# Patient Record
Sex: Male | Born: 1945 | Race: Black or African American | Hispanic: No
Health system: Southern US, Community
[De-identification: ages and names within clinical notes are randomized; demographics above are authoritative.]

---

## 2014-04-17 ENCOUNTER — Other Ambulatory Visit (HOSPITAL_COMMUNITY): Payer: Medicare Other

## 2014-04-17 ENCOUNTER — Inpatient Hospital Stay
Admission: RE | Admit: 2014-04-17 | Discharge: 2014-05-25 | Disposition: A | Discharge status: Skilled Nursing Facility | Payer: Medicare Other | Source: Other Acute Inpatient Hospital | Attending: Internal Medicine | Admitting: Internal Medicine

## 2014-04-17 DIAGNOSIS — L97929 Non-pressure chronic ulcer of unspecified part of left lower leg with unspecified severity: Secondary | ICD-10-CM

## 2014-04-17 DIAGNOSIS — J189 Pneumonia, unspecified organism: Secondary | ICD-10-CM

## 2014-04-17 DIAGNOSIS — J9690 Respiratory failure, unspecified, unspecified whether with hypoxia or hypercapnia: Secondary | ICD-10-CM

## 2014-04-17 DIAGNOSIS — J9620 Acute and chronic respiratory failure, unspecified whether with hypoxia or hypercapnia: Secondary | ICD-10-CM

## 2014-04-17 DIAGNOSIS — A419 Sepsis, unspecified organism: Secondary | ICD-10-CM

## 2014-04-17 DIAGNOSIS — Z0189 Encounter for other specified special examinations: Secondary | ICD-10-CM

## 2014-04-17 DIAGNOSIS — Z930 Tracheostomy status: Secondary | ICD-10-CM

## 2014-04-17 DIAGNOSIS — N179 Acute kidney failure, unspecified: Secondary | ICD-10-CM

## 2014-04-17 DIAGNOSIS — G9341 Metabolic encephalopathy: Secondary | ICD-10-CM

## 2014-04-17 DIAGNOSIS — E1110 Type 2 diabetes mellitus with ketoacidosis without coma: Secondary | ICD-10-CM

## 2014-04-17 DIAGNOSIS — Z9911 Dependence on respirator [ventilator] status: Secondary | ICD-10-CM

## 2014-04-17 DIAGNOSIS — E870 Hyperosmolality and hypernatremia: Secondary | ICD-10-CM

## 2014-04-17 DIAGNOSIS — R109 Unspecified abdominal pain: Secondary | ICD-10-CM | POA: Insufficient documentation

## 2014-04-17 DIAGNOSIS — R6521 Severe sepsis with septic shock: Secondary | ICD-10-CM

## 2014-04-17 DIAGNOSIS — Z95828 Presence of other vascular implants and grafts: Secondary | ICD-10-CM

## 2014-04-17 DIAGNOSIS — Z89511 Acquired absence of right leg below knee: Secondary | ICD-10-CM

## 2014-04-17 DIAGNOSIS — Z13810 Encounter for screening for upper gastrointestinal disorder: Secondary | ICD-10-CM

## 2014-04-17 DIAGNOSIS — R627 Adult failure to thrive: Secondary | ICD-10-CM

## 2014-04-17 DIAGNOSIS — I639 Cerebral infarction, unspecified: Secondary | ICD-10-CM

## 2014-04-17 LAB — COMPREHENSIVE METABOLIC PANEL
ALT: 17 U/L (ref 0–53)
ANION GAP: 14 (ref 5–15)
AST: 24 U/L (ref 0–37)
Albumin: 4 g/dL (ref 3.5–5.2)
Alkaline Phosphatase: 40 U/L (ref 39–117)
BUN: 34 mg/dL — AB (ref 6–23)
CALCIUM: 8.4 mg/dL (ref 8.4–10.5)
CO2: 32 mmol/L (ref 19–32)
CREATININE: 1.59 mg/dL — AB (ref 0.50–1.35)
Chloride: 106 mmol/L (ref 96–112)
GFR calc non Af Amer: 42 mL/min — ABNORMAL LOW (ref >90)
GFR, EST AFRICAN AMERICAN: 49 mL/min — AB (ref >90)
Glucose, Bld: 150 mg/dL — ABNORMAL HIGH (ref 70–99)
Potassium: 3.3 mmol/L — ABNORMAL LOW (ref 3.5–5.1)
SODIUM: 152 mmol/L — AB (ref 135–145)
TOTAL PROTEIN: 6.4 g/dL (ref 6.0–8.3)
Total Bilirubin: 0.7 mg/dL (ref 0.3–1.2)

## 2014-04-17 LAB — CBC
HCT: 24.5 % — ABNORMAL LOW (ref 39.0–52.0)
HEMOGLOBIN: 7.6 g/dL — AB (ref 13.0–17.0)
MCH: 26.9 pg (ref 26.0–34.0)
MCHC: 31 g/dL (ref 30.0–36.0)
MCV: 86.6 fL (ref 78.0–100.0)
Platelets: 143 10*3/uL — ABNORMAL LOW (ref 150–400)
RBC: 2.83 MIL/uL — ABNORMAL LOW (ref 4.22–5.81)
RDW: 15.6 % — ABNORMAL HIGH (ref 11.5–15.5)
WBC: 9.4 10*3/uL (ref 4.0–10.5)

## 2014-04-17 LAB — PROTIME-INR
INR: 1.14 (ref 0.00–1.49)
Prothrombin Time: 14.8 seconds (ref 11.6–15.2)

## 2014-04-17 LAB — PHOSPHORUS: Phosphorus: 4.2 mg/dL (ref 2.3–4.6)

## 2014-04-17 LAB — MAGNESIUM: Magnesium: 2.1 mg/dL (ref 1.5–2.5)

## 2014-04-18 ENCOUNTER — Other Ambulatory Visit (HOSPITAL_COMMUNITY): Payer: Medicare Other

## 2014-04-18 DIAGNOSIS — R627 Adult failure to thrive: Secondary | ICD-10-CM

## 2014-04-18 DIAGNOSIS — J9620 Acute and chronic respiratory failure, unspecified whether with hypoxia or hypercapnia: Secondary | ICD-10-CM

## 2014-04-18 DIAGNOSIS — A419 Sepsis, unspecified organism: Secondary | ICD-10-CM

## 2014-04-18 DIAGNOSIS — Z89511 Acquired absence of right leg below knee: Secondary | ICD-10-CM

## 2014-04-18 DIAGNOSIS — R6521 Severe sepsis with septic shock: Secondary | ICD-10-CM

## 2014-04-18 DIAGNOSIS — Z9911 Dependence on respirator [ventilator] status: Secondary | ICD-10-CM

## 2014-04-18 DIAGNOSIS — L97929 Non-pressure chronic ulcer of unspecified part of left lower leg with unspecified severity: Secondary | ICD-10-CM

## 2014-04-18 DIAGNOSIS — G9341 Metabolic encephalopathy: Secondary | ICD-10-CM

## 2014-04-18 DIAGNOSIS — J9601 Acute respiratory failure with hypoxia: Secondary | ICD-10-CM

## 2014-04-18 DIAGNOSIS — I639 Cerebral infarction, unspecified: Secondary | ICD-10-CM

## 2014-04-18 DIAGNOSIS — N179 Acute kidney failure, unspecified: Secondary | ICD-10-CM

## 2014-04-18 DIAGNOSIS — E1110 Type 2 diabetes mellitus with ketoacidosis without coma: Secondary | ICD-10-CM

## 2014-04-18 DIAGNOSIS — J189 Pneumonia, unspecified organism: Secondary | ICD-10-CM | POA: Diagnosis not present

## 2014-04-18 DIAGNOSIS — E870 Hyperosmolality and hypernatremia: Secondary | ICD-10-CM

## 2014-04-18 LAB — BASIC METABOLIC PANEL
ANION GAP: 17 — AB (ref 5–15)
BUN: 40 mg/dL — AB (ref 6–23)
CALCIUM: 8.8 mg/dL (ref 8.4–10.5)
CHLORIDE: 109 mmol/L (ref 96–112)
CO2: 26 mmol/L (ref 19–32)
CREATININE: 1.56 mg/dL — AB (ref 0.50–1.35)
GFR calc Af Amer: 50 mL/min — ABNORMAL LOW (ref >90)
GFR calc non Af Amer: 43 mL/min — ABNORMAL LOW (ref >90)
Glucose, Bld: 151 mg/dL — ABNORMAL HIGH (ref 70–99)
POTASSIUM: 5 mmol/L (ref 3.5–5.1)
Sodium: 152 mmol/L — ABNORMAL HIGH (ref 135–145)

## 2014-04-18 LAB — BLOOD GAS, ARTERIAL
Acid-Base Excess: 9.1 mmol/L — ABNORMAL HIGH (ref 0.0–2.0)
BICARBONATE: 32.9 meq/L — AB (ref 20.0–24.0)
FIO2: 0.35 %
MECHVT: 500 mL
O2 Saturation: 98.4 %
PEEP/CPAP: 5 cmH2O
Patient temperature: 99.7
RATE: 12 resp/min
TCO2: 34.2 mmol/L (ref 0–100)
pCO2 arterial: 44.6 mmHg (ref 35.0–45.0)
pH, Arterial: 7.483 — ABNORMAL HIGH (ref 7.350–7.450)
pO2, Arterial: 110 mmHg — ABNORMAL HIGH (ref 80.0–100.0)

## 2014-04-18 LAB — URINE MICROSCOPIC-ADD ON

## 2014-04-18 LAB — CBC
HEMATOCRIT: 28.2 % — AB (ref 39.0–52.0)
HEMOGLOBIN: 8.6 g/dL — AB (ref 13.0–17.0)
MCH: 26.3 pg (ref 26.0–34.0)
MCHC: 30.5 g/dL (ref 30.0–36.0)
MCV: 86.2 fL (ref 78.0–100.0)
PLATELETS: 158 10*3/uL (ref 150–400)
RBC: 3.27 MIL/uL — AB (ref 4.22–5.81)
RDW: 16 % — ABNORMAL HIGH (ref 11.5–15.5)
WBC: 11.7 10*3/uL — ABNORMAL HIGH (ref 4.0–10.5)

## 2014-04-18 LAB — URINALYSIS, ROUTINE W REFLEX MICROSCOPIC
Bilirubin Urine: NEGATIVE
Glucose, UA: NEGATIVE mg/dL
KETONES UR: NEGATIVE mg/dL
Leukocytes, UA: NEGATIVE
NITRITE: NEGATIVE
Protein, ur: NEGATIVE mg/dL
Specific Gravity, Urine: 1.011 (ref 1.005–1.030)
Urobilinogen, UA: 0.2 mg/dL (ref 0.0–1.0)
pH: 6.5 (ref 5.0–8.0)

## 2014-04-18 LAB — VANCOMYCIN, TROUGH: Vancomycin Tr: 21.2 ug/mL — ABNORMAL HIGH (ref 10.0–20.0)

## 2014-04-18 LAB — CLOSTRIDIUM DIFFICILE BY PCR: Toxigenic C. Difficile by PCR: NEGATIVE

## 2014-04-18 NOTE — Consult Note (Signed)
Name: Barry Zimmerman MRN: [id number] DOB: October 08, 1945    ADMISSION DATE:  04/17/2014 CONSULTATION DATE:  4/2  REFERRING MD :  [facility]  CHIEF COMPLAINT:  SOB  BRIEF PATIENT DESCRIPTION: Elderly male  SIGNIFICANT EVENTS  4/1 tx to ssh  STUDIES:     HISTORY OF PRESENT ILLNESS:  Barry Zimmerman is a 69 yo with a plethora of health problems who was at the wound care clinic in [place]   [place] On approximately 3/26 when he felt swimmy headed. Taken to local hospital and treated for HCAP, sepsis, ams. He required intubated and was then extubated 3/31 and promptly failed , vomiting on bipap and required reintubation. Transferred to [facility] 4/1 on full vent support, diprivan drip for sedation and abx. PCCM called for ventilator management 4/2.  PAST MEDICAL HISTORY :     Ventilator dependence   Acute on chronic respiratory failure   HCAP (healthcare-associated pneumonia)   Septic shock   DM (diabetes mellitus) type 2, uncontrolled, with ketoacidosis   Rt CVA (cerebral vascular accident)   AKI (acute kidney injury)   Hypernatremia   Encephalopathy, metabolic   Hx of right BKA   Leg ulcer, left   FTT (failure to thrive) in adult     FAMILY HISTORY:   Reviewed SOCIAL HISTORY: reviewed  REVIEW OF SYSTEMS:   na   VITAL SIGNS: Vital signs reviewed. Abnormal values will appear under impression plan section.   PHYSICAL EXAMINATION: General:  Frail ill male on full vent support and sedation Neuro: Sedated with diprivan, tracks HEENT:  7.5 OTT->vent NGT with tube feeds Cardiovascular:  HSR  Lungs: decreased bs bases Abdomen:  +BS SOFT Musculoskeletal:  rT BKA   Skin:  Warm   Recent Labs Lab 04/17/14 1818  NA 152*  K 3.3*  CL 106  CO2 32  BUN 34*  CREATININE 1.59*  GLUCOSE 150*    Recent Labs Lab 04/17/14 1818 04/18/14 0545  HGB 7.6* 8.6*  HCT 24.5* 28.2*  WBC 9.4 11.7*  PLT 143* 158   Dg Chest Port 1 View  04/18/2014   CLINICAL DATA:  Followup pneumonia.  EXAM:  PORTABLE CHEST - 1 VIEW  COMPARISON:  04/17/2014  FINDINGS: There is persistent opacity at both lung bases obscuring hemidiaphragms consistent with a combination of pleural effusions with probable atelectasis. Pneumonia at either or both lung bases is not excluded. There is no overt pulmonary edema.  No pneumothorax.  Endotracheal tube, right PICC and nasogastric tube are stable in well positioned.  IMPRESSION: 1. No significant change from the previous day's study. 2. Support apparatus remains well-positioned. 3. Persistent lung base opacity consistent with a combination pleural effusions and atelectasis. Pneumonia is possible.   Electronically Signed   By: Amie Portland M.D.   On: 04/18/2014 07:37   Dg Chest Port 1 View  04/17/2014   CLINICAL DATA:  Central line placement  EXAM: PORTABLE CHEST - 1 VIEW  COMPARISON:  None.  FINDINGS: Endotracheal tube has its tip 4 cm above the carina. Nasogastric tube enters the abdomen. Right arm PICC has its tip at the SVC RA junction. Artifact overlies the chest. Heart size is at the upper limits of normal. There is calcification of the thoracic aorta. There are bilateral pleural effusions. There is volume loss in both lower lobes.  IMPRESSION: Lines and tubes well positioned. Bilateral effusions and lower lobe volume loss.   Electronically Signed   By: Paulina Fusi M.D.   On: 04/17/2014 20:38   Dg  Abd Portable 1v  04/17/2014   CLINICAL DATA:  Nasogastric tube placement  EXAM: PORTABLE ABDOMEN - 1 VIEW  COMPARISON:  None.  FINDINGS: The nasogastric tube extends well into the stomach with tip in the region of the distal gastric body.  IMPRESSION: Satisfactorily positioned nasogastric tube.   Electronically Signed   By: Ellery Plunkaniel R Mitchell M.D.   On: 04/17/2014 18:43    ASSESSMENT     Ventilator dependence   Acute on chronic respiratory failure   HCAP (healthcare-associated pneumonia)   Septic shock -resolved   DM (diabetes mellitus) type 2, uncontrolled, with  ketoacidosis   CVA (cerebral vascular accident)   AKI (acute kidney injury) on CKD   Hypernatremia   Encephalopathy, metabolic   Hx of right BKA   Leg ulcer, left   FTT (failure to thrive) in adult  Discussion: Barry Zimmerman is a 69 yo with a plethora of health problems who [place] On approximately 3/26 when he felt swimmy headed. Taken to local hospital and treated for HCAP, sepsis, ams. He required intubated and was then extubated 3/31 and promptly failed and required reintubation. [facility] on full vent support, diprivan drip for sedation and abx. PCCM called for ventilator management 4/2.  PLAN: Wean protocol order written 4/2 DC diprivan drip When stable attempt extubation. Consider evaluating code code status.  Brett CanalesSteve Minor ACNP [facility] Pager [phone] till 3 pm If no answer page [phone] 04/18/2014, 9:28 AM   ATTENDING NOTE: I have personally reviewed patient's available data, including medical history, events of note, physical examination and test results as part of my evaluation. I have discussed with resident/NP and other careteam providers such as pharmacist, RN and RRT & co-ordinated with consultants. In addition, I personally evaluated patient and elicited key history of [facility] due to vomiting on BiPAP, reintubated and transferred her Diprivan drip, right AKA and old right CVA, exam findings of left hemiplegia, follows commands when propofol tapered off, clear lungs & labs showing hypernatremia, acute kidney injury.  Chest x-ray suggests bilateral effusions-cannot rule out pneumonia  Recommend- pressure-support weaning, would suggest another trial of extubation when he meets criteria. Continue diuresis-as renal function and serum sodium will permit. Propofol drip and intermittent fentanyl for sedation Rest per NP whose note is outlined above and that I agree with and edited in full.     Oretha MilchALVA,Brithney Bensen V. MD

## 2014-04-19 ENCOUNTER — Other Ambulatory Visit (HOSPITAL_COMMUNITY): Payer: Medicare Other

## 2014-04-19 LAB — URINE CULTURE
Colony Count: NO GROWTH
Culture: NO GROWTH
Special Requests: NORMAL

## 2014-04-19 LAB — BASIC METABOLIC PANEL
Anion gap: 12 (ref 5–15)
BUN: 41 mg/dL — ABNORMAL HIGH (ref 6–23)
CALCIUM: 9.1 mg/dL (ref 8.4–10.5)
CO2: 32 mmol/L (ref 19–32)
Chloride: 106 mmol/L (ref 96–112)
Creatinine, Ser: 1.47 mg/dL — ABNORMAL HIGH (ref 0.50–1.35)
GFR calc Af Amer: 54 mL/min — ABNORMAL LOW (ref >90)
GFR calc non Af Amer: 47 mL/min — ABNORMAL LOW (ref >90)
Glucose, Bld: 150 mg/dL — ABNORMAL HIGH (ref 70–99)
POTASSIUM: 4.5 mmol/L (ref 3.5–5.1)
Sodium: 150 mmol/L — ABNORMAL HIGH (ref 135–145)

## 2014-04-19 LAB — CBC
HEMATOCRIT: 27.6 % — AB (ref 39.0–52.0)
HEMOGLOBIN: 8.4 g/dL — AB (ref 13.0–17.0)
MCH: 26.5 pg (ref 26.0–34.0)
MCHC: 30.4 g/dL (ref 30.0–36.0)
MCV: 87.1 fL (ref 78.0–100.0)
PLATELETS: 171 10*3/uL (ref 150–400)
RBC: 3.17 MIL/uL — ABNORMAL LOW (ref 4.22–5.81)
RDW: 15.9 % — ABNORMAL HIGH (ref 11.5–15.5)
WBC: 10.6 10*3/uL — ABNORMAL HIGH (ref 4.0–10.5)

## 2014-04-19 LAB — VANCOMYCIN, TROUGH: Vancomycin Tr: 28 ug/mL (ref 10.0–20.0)

## 2014-04-20 DIAGNOSIS — E1310 Other specified diabetes mellitus with ketoacidosis without coma: Secondary | ICD-10-CM | POA: Diagnosis not present

## 2014-04-20 DIAGNOSIS — I639 Cerebral infarction, unspecified: Secondary | ICD-10-CM

## 2014-04-20 DIAGNOSIS — J9620 Acute and chronic respiratory failure, unspecified whether with hypoxia or hypercapnia: Secondary | ICD-10-CM | POA: Diagnosis not present

## 2014-04-20 DIAGNOSIS — N179 Acute kidney failure, unspecified: Secondary | ICD-10-CM | POA: Diagnosis not present

## 2014-04-20 LAB — CBC
HCT: 25.2 % — ABNORMAL LOW (ref 39.0–52.0)
HEMOGLOBIN: 7.8 g/dL — AB (ref 13.0–17.0)
MCH: 26.6 pg (ref 26.0–34.0)
MCHC: 31 g/dL (ref 30.0–36.0)
MCV: 86 fL (ref 78.0–100.0)
Platelets: 171 10*3/uL (ref 150–400)
RBC: 2.93 MIL/uL — ABNORMAL LOW (ref 4.22–5.81)
RDW: 15.4 % (ref 11.5–15.5)
WBC: 10.7 10*3/uL — AB (ref 4.0–10.5)

## 2014-04-20 LAB — BASIC METABOLIC PANEL
Anion gap: 14 (ref 5–15)
BUN: 45 mg/dL — AB (ref 6–23)
CHLORIDE: 101 mmol/L (ref 96–112)
CO2: 31 mmol/L (ref 19–32)
Calcium: 9.5 mg/dL (ref 8.4–10.5)
Creatinine, Ser: 1.64 mg/dL — ABNORMAL HIGH (ref 0.50–1.35)
GFR calc Af Amer: 47 mL/min — ABNORMAL LOW (ref >90)
GFR, EST NON AFRICAN AMERICAN: 41 mL/min — AB (ref >90)
Glucose, Bld: 129 mg/dL — ABNORMAL HIGH (ref 70–99)
POTASSIUM: 4.9 mmol/L (ref 3.5–5.1)
SODIUM: 146 mmol/L — AB (ref 135–145)

## 2014-04-20 LAB — CLOSTRIDIUM DIFFICILE BY PCR: CDIFFPCR: NEGATIVE

## 2014-04-20 NOTE — Procedures (Signed)
US chest  1. Bilateral moderate effusions left greater rt, no loculations, rt has higher risk PTX with lung close to 1.4 cm to pleura  Mcarthur Rossettianiel J. Tyson AliasFeinstein, MD, FACP Pgr: [phone] [place] [facility]

## 2014-04-20 NOTE — Progress Notes (Signed)
Name: Barry Zimmerman MRN: [id number] DOB: May 22, 1945    ADMISSION DATE:  04/17/2014 CONSULTATION DATE:  4/2  [facility] :  [facility]  CHIEF COMPLAINT:  SOB  BRIEF PATIENT DESCRIPTION Barry Zimmerman is a 69 yo with a plethora of health problems who [place] On approximately 3/26 when he felt swimmy headed. Taken to local hospital and treated for HCAP, sepsis, ams. He required intubated and was then extubated 3/31 and promptly failed , vomiting on bipap and required reintubation. [facility], diprivan drip for sedation and abx. PCCM called for ventilator management 4/2.  SIGNIFICANT EVENTS  4/1 tx to ssh  STUDIES:   VITAL SIGNS: Vital signs reviewed. Abnormal values will appear under impression plan section.   PHYSICAL EXAMINATION: General:  Frail ill male on full vent support  Neuro: awake, attempts to communicate. F/c  HEENT:  7.5 OTT->vent NGT with tube feeds Cardiovascular:  HSR  Lungs: decreased bs bases Abdomen:  +BS SOFT Musculoskeletal:  rT BKA   Skin:  Warm   Recent Labs Lab 04/18/14 1015 04/19/14 0539 04/20/14 0525  NA 152* 150* 146*  K 5.0 4.5 4.9  CL 109 106 101  CO2 26 32 31  BUN 40* 41* 45*  CREATININE 1.56* 1.47* 1.64*  GLUCOSE 151* 150* 129*    Recent Labs Lab 04/18/14 0545 04/19/14 0539 04/20/14 0525  HGB 8.6* 8.4* 7.8*  HCT 28.2* 27.6* 25.2*  WBC 11.7* 10.6* 10.7*  PLT 158 171 171   Dg Chest Port 1 View  04/19/2014   CLINICAL DATA:  Pneumonia.  Ventilator support.  EXAM: PORTABLE CHEST - 1 VIEW  COMPARISON:  04/18/2014  FINDINGS: Endotracheal tube has its tip 4 cm above the carina. Nasogastric tube enters the stomach. Right arm PICC has its tip at the SVC RA junction. There is pulmonary edema with effusions and lower lobe volume loss. Slight improvement since yesterday. No new findings.  IMPRESSION: Lines and tubes well positioned. Edema with effusions and lower lobe volume loss  persists.   Electronically Signed   By: Paulina FusiMark  Shogry M.D.   On: 04/19/2014 10:14    ASSESSMENT     Ventilator dependence   Acute on chronic respiratory failure   HCAP (healthcare-associated pneumonia)   Septic shock -resolved   DM (diabetes mellitus) type 2, uncontrolled, with ketoacidosis   CVA (cerebral vascular accident)   AKI (acute kidney injury) on CKD   Hypernatremia   Encephalopathy, metabolic   Hx of right BKA   Leg ulcer, left   FTT (failure to thrive) in adult  Discussion Looks ok. Deconditioning appears to be his biggest barrier. Tolerating PSV but requiring 12 cm H2O to have acceptable F/Vt.   PLAN: Cont vanc/zosyn per IM Service  Wean protocol order written 4/2 Will evaluate right chest via US ? Therapeutic thora Will see how he does over next few days... Might need to consider trach Consider evaluating code code status.  04/20/2014, 11:17 AM     Anders SimmondsBABCOCK,PETE MD   STAFF NOTE: Cindi CarbonI, Daniel Feinstein, MD FACP have personally reviewed patient's available data, including medical history, events of note, physical examination and test results as part of my evaluation. I have discussed with resident/NP and other care providers such as pharmacist, RN and RRT. In addition, I personally evaluated patient and elicited key findings of: reduced rt base examination, would use lasix with neg balance goals and free water to correct Na,  follow crt closely during this, will [place] rt base to assess need diag / there thora, PS to 10 as goal, goal 5 cc/kg rate less 35, upright as able, ABX de escalations per primary, consider use pCT algorithm, or narrow to ceftriaxone, no abg required, may need slight reduction in MV on vent, pcxr in am   The patient is critically ill with multiple organ systems failure and requires high complexity decision making for assessment and support, frequent evaluation and titration of therapies, application of advanced monitoring technologies and extensive  interpretation of multiple databases.   Critical Care Time devoted to patient care services described in this note is30 Minutes. This time reflects time of care of this signee: Barry Percy, MD FACP. This critical care time does not reflect procedure time, or teaching time or supervisory time of PA/NP/Med student/Med Resident etc but could involve care discussion time. Rest per NP/medical resident whose note is outlined above and that I agree with   Barry Zimmerman. Barry Alias, MD, FACP Pgr: [phone] [place] [facility] 04/20/2014 1:09 PM

## 2014-04-21 DIAGNOSIS — E1310 Other specified diabetes mellitus with ketoacidosis without coma: Secondary | ICD-10-CM | POA: Diagnosis not present

## 2014-04-21 DIAGNOSIS — G9341 Metabolic encephalopathy: Secondary | ICD-10-CM

## 2014-04-21 DIAGNOSIS — N179 Acute kidney failure, unspecified: Secondary | ICD-10-CM | POA: Diagnosis not present

## 2014-04-21 DIAGNOSIS — J9620 Acute and chronic respiratory failure, unspecified whether with hypoxia or hypercapnia: Secondary | ICD-10-CM | POA: Diagnosis not present

## 2014-04-21 DIAGNOSIS — I639 Cerebral infarction, unspecified: Secondary | ICD-10-CM | POA: Diagnosis not present

## 2014-04-21 LAB — PROTIME-INR
INR: 1.17 (ref 0.00–1.49)
Prothrombin Time: 15 seconds (ref 11.6–15.2)

## 2014-04-21 LAB — OCCULT BLOOD X 1 CARD TO LAB, STOOL: Fecal Occult Bld: NEGATIVE

## 2014-04-21 LAB — VANCOMYCIN, RANDOM: Vancomycin Rm: 15.4 ug/mL

## 2014-04-21 NOTE — Progress Notes (Signed)
   Name: Barry Zimmerman MRN: [id number] DOB: [birth year]    ADMISSION DATE:  04/17/2014 CONSULTATION DATE:  4/2  [facility]  CHIEF COMPLAINT:  SOB  BRIEF PATIENT DESCRIPTION Barry Zimmerman is a 69 yo with a plethora of health problems who was [place] On approximately 3/26 when he felt swimmy headed. Taken to local hospital and treated for HCAP, sepsis, ams. He required intubated and was then extubated 3/31 and promptly failed , vomiting on bipap and required reintubation. [facility], diprivan drip for sedation and abx. PCCM called for ventilator management 4/2.  SIGNIFICANT EVENTS / studies 4/1 tx to ssh 4/4- US chest- bilat mod effusions, with atx left   VITAL SIGNS: Vital signs reviewed. Abnormal values will appear under impression plan section.   PHYSICAL EXAMINATION: General:  Frail ill male on full vent support  Neuro: awake, attempts to communicate. F/c and consented to trach , understood risks, benefits HEENT:  jvd low NGT with tube feeds Cardiovascular:  s1 s2 RRR  Lungs: decreased but clear Abdomen:  +BS SOFT no r/g Musculoskeletal:  rT BKA   Skin:  Warm   Recent Labs Lab 04/18/14 1015 04/19/14 0539 04/20/14 0525  NA 152* 150* 146*  K 5.0 4.5 4.9  CL 109 106 101  CO2 26 32 31  BUN 40* 41* 45*  CREATININE 1.56* 1.47* 1.64*  GLUCOSE 151* 150* 129*    Recent Labs Lab 04/18/14 0545 04/19/14 0539 04/20/14 0525  HGB 8.6* 8.4* 7.8*  HCT 28.2* 27.6* 25.2*  WBC 11.7* 10.6* 10.7*  PLT 158 171 171   No results found.  ASSESSMENT     Ventilator dependence   Acute on chronic respiratory failure   HCAP (healthcare-associated pneumonia)   Septic shock -resolved   DM (diabetes mellitus) type 2, uncontrolled, with ketoacidosis   CVA (cerebral vascular accident)   AKI (acute kidney injury) on CKD   Hypernatremia   Encephalopathy, metabolic   Hx of right BKA   Leg ulcer, left   FTT  (failure to thrive) in adult  Discussion Still with sig support on weaning US chest noted, unlikely to improve weaning success to thora ETT 3/21, prolonged vent needs now and not even close to extubatio Requires trach  PLAN: Consider shortening or dc ABX Not impressed infection / PNA active pcxr c/w effusions noted on US weaing this am , PS 12 to goal 10 if able, cpap 5 Given duration ETT and now PS 12 required = trach Have consented pt and will cal son, plan trach thur at 1030 am  Hold plavix Rounded with all providers, rn, rt Evaluated pt at bedside for weaning parameters and mechanics Npo order for am thur Limited PT can be done with hemiplegia and aka Ccm time 30 min   Mcarthur Rossettianiel J. Tyson AliasFeinstein, MD, FACP Pgr: [phone] [place] [facility]

## 2014-04-22 ENCOUNTER — Other Ambulatory Visit (HOSPITAL_COMMUNITY): Payer: Medicare Other

## 2014-04-22 DIAGNOSIS — N179 Acute kidney failure, unspecified: Secondary | ICD-10-CM | POA: Diagnosis not present

## 2014-04-22 DIAGNOSIS — J9620 Acute and chronic respiratory failure, unspecified whether with hypoxia or hypercapnia: Secondary | ICD-10-CM | POA: Diagnosis not present

## 2014-04-22 DIAGNOSIS — E1310 Other specified diabetes mellitus with ketoacidosis without coma: Secondary | ICD-10-CM | POA: Diagnosis not present

## 2014-04-22 DIAGNOSIS — I639 Cerebral infarction, unspecified: Secondary | ICD-10-CM | POA: Diagnosis not present

## 2014-04-22 DIAGNOSIS — R0600 Dyspnea, unspecified: Secondary | ICD-10-CM | POA: Diagnosis not present

## 2014-04-22 LAB — CBC WITH DIFFERENTIAL/PLATELET
Basophils Absolute: 0 10*3/uL (ref 0.0–0.1)
Basophils Relative: 0 % (ref 0–1)
EOS ABS: 0.5 10*3/uL (ref 0.0–0.7)
Eosinophils Relative: 6 % — ABNORMAL HIGH (ref 0–5)
HCT: 24.2 % — ABNORMAL LOW (ref 39.0–52.0)
HEMOGLOBIN: 7.3 g/dL — AB (ref 13.0–17.0)
LYMPHS PCT: 11 % — AB (ref 12–46)
Lymphs Abs: 0.9 10*3/uL (ref 0.7–4.0)
MCH: 26.2 pg (ref 26.0–34.0)
MCHC: 30.2 g/dL (ref 30.0–36.0)
MCV: 86.7 fL (ref 78.0–100.0)
MONOS PCT: 8 % (ref 3–12)
Monocytes Absolute: 0.6 10*3/uL (ref 0.1–1.0)
Neutro Abs: 5.8 10*3/uL (ref 1.7–7.7)
Neutrophils Relative %: 75 % (ref 43–77)
Platelets: 198 10*3/uL (ref 150–400)
RBC: 2.79 MIL/uL — ABNORMAL LOW (ref 4.22–5.81)
RDW: 15.2 % (ref 11.5–15.5)
WBC: 7.7 10*3/uL (ref 4.0–10.5)

## 2014-04-22 LAB — RENAL FUNCTION PANEL
Albumin: 3.7 g/dL (ref 3.5–5.2)
Anion gap: 13 (ref 5–15)
BUN: 52 mg/dL — AB (ref 6–23)
CO2: 29 mmol/L (ref 19–32)
Calcium: 9.4 mg/dL (ref 8.4–10.5)
Chloride: 104 mmol/L (ref 96–112)
Creatinine, Ser: 1.8 mg/dL — ABNORMAL HIGH (ref 0.50–1.35)
GFR calc Af Amer: 42 mL/min — ABNORMAL LOW (ref >90)
GFR calc non Af Amer: 36 mL/min — ABNORMAL LOW (ref >90)
Glucose, Bld: 152 mg/dL — ABNORMAL HIGH (ref 70–99)
PHOSPHORUS: 4.5 mg/dL (ref 2.3–4.6)
Potassium: 4.9 mmol/L (ref 3.5–5.1)
SODIUM: 146 mmol/L — AB (ref 135–145)

## 2014-04-22 LAB — PROCALCITONIN: PROCALCITONIN: 0.41 ng/mL

## 2014-04-22 LAB — ABO/RH: ABO/RH(D): O POS

## 2014-04-22 LAB — PHOSPHORUS: Phosphorus: 4.6 mg/dL (ref 2.3–4.6)

## 2014-04-22 LAB — OCCULT BLOOD X 1 CARD TO LAB, STOOL: Fecal Occult Bld: NEGATIVE

## 2014-04-22 LAB — MAGNESIUM: MAGNESIUM: 2.4 mg/dL (ref 1.5–2.5)

## 2014-04-22 LAB — PREPARE RBC (CROSSMATCH)

## 2014-04-22 NOTE — Progress Notes (Signed)
Name: Mikhi Athey MRN: [id number] DOB: 1945/08/15    ADMISSION DATE:  04/17/2014 CONSULTATION DATE:  4/2  REFERRING MD :  [facility]  CHIEF COMPLAINT:  SOB  BRIEF PATIENT DESCRIPTION Mr. Trim is a 69 yo with a plethora of health problems who was at [place] wound care clinic in [place] On approximately 3/26 when he felt swimmy headed. Taken to local hospital and treated for HCAP, sepsis, ams. He required intubated and was then extubated 3/31 and promptly failed , vomiting on bipap and required reintubation. Transferred to [facility] 4/1 on full vent support, diprivan drip for sedation and abx. PCCM called for ventilator management 4/2.  SIGNIFICANT EVENTS / studies 4/1 tx to ssh 4/4- [place] chest- bilat mod effusions, with atx left   VITAL SIGNS: Vital signs reviewed. Abnormal values will appear under impression plan section.   PHYSICAL EXAMINATION: General:  Frail ill male on full vent support  Neuro: awake, communicates easily HEENT:  jvd wnl NGT with tube feeds Cardiovascular:  s1 s2 RRR  Lungs: decreased more crackles Abdomen:  +BS SOFT no r/g Musculoskeletal:  rT BKA   Skin:  Warm   Recent Labs Lab 04/19/14 0539 04/20/14 0525 04/22/14 0530  NA 150* 146* 146*  K 4.5 4.9 4.9  CL 106 101 104  CO2 32 31 29  BUN 41* 45* 52*  CREATININE 1.47* 1.64* 1.80*  GLUCOSE 150* 129* 152*    Recent Labs Lab 04/19/14 0539 04/20/14 0525 04/22/14 0530  HGB 8.4* 7.8* 7.3*  HCT 27.6* 25.2* 24.2*  WBC 10.6* 10.7* 7.7  PLT 171 171 198   Dg Chest Port 1 View  04/22/2014   CLINICAL DATA:  Sepsis, pneumonia, CVA ; intubated patient.  EXAM: PORTABLE CHEST - 1 VIEW  COMPARISON:  Portable chest x-ray of April 19, 2014  FINDINGS: The lungs are adequately inflated. There are bilateral pleural effusions greater on the right than on the left. The retrocardiac region remains dense. The cardiac silhouette is top-normal in size and stable. There is engorgement of the central pulmonary vascularity.   The endotracheal tube tip lies 4 cm above the carina. The esophagogastric tube projects below the inferior margin of the image. The right-sided PICC line tip projects over the middle third of the SVC.  IMPRESSION: Fairly stable appearance of the chest since yesterday's study. There are persistent bilateral pleural effusions, left lower lobe atelectasis, and mild pulmonary vascular congestion.   Electronically Signed   By: David  [place]   On: 04/22/2014 07:37    ASSESSMENT     Ventilator dependence   Acute on chronic respiratory failure   HCAP (healthcare-associated pneumonia)   Septic shock -resolved   DM (diabetes mellitus) type 2, uncontrolled, with ketoacidosis   CVA (cerebral vascular accident)   AKI (acute kidney injury) on CKD   Hypernatremia   Encephalopathy, metabolic   Hx of right BKA   Leg ulcer, left   FTT (failure to thrive) in adult  Discussion Still with sig support on weaning [place] chest noted, unlikely to improve weaning success to thora ETT 3/21, prolonged vent needs now and not even close to extubatio Requires trach, planned 1030 thur  PLAN: I called son twice and discussed trach needs, consented, extensive Weaning today PS 12 again required, goal is 10 today if able, goal 4-6 hrs uproght in bed as able D/w RT pcxr follow up Plan trach will order sedation, and npo at 5 am coags wnl Would continued to HOLD lasix, see rise  renal fxn Hold plavix still Sub q heparin is ok It is likley that if post trach not weaning well, may need to thora left chest  Ccm time 30 min including familyupdates  Mcarthur Rossettianiel J. Tyson AliasFeinstein, MD, FACP Pgr: [phone] [place] [facility]

## 2014-04-22 NOTE — Progress Notes (Signed)
  Echocardiogram 2D Echocardiogram has been performed.  Leta JunglingCooper, Emrys Mckamie M 04/22/2014, 4:34 PM

## 2014-04-23 ENCOUNTER — Encounter (HOSPITAL_COMMUNITY): Payer: Medicare Other

## 2014-04-23 ENCOUNTER — Other Ambulatory Visit (HOSPITAL_COMMUNITY): Payer: Medicare Other

## 2014-04-23 DIAGNOSIS — I639 Cerebral infarction, unspecified: Secondary | ICD-10-CM | POA: Diagnosis not present

## 2014-04-23 DIAGNOSIS — J9620 Acute and chronic respiratory failure, unspecified whether with hypoxia or hypercapnia: Secondary | ICD-10-CM | POA: Diagnosis not present

## 2014-04-23 DIAGNOSIS — N179 Acute kidney failure, unspecified: Secondary | ICD-10-CM | POA: Diagnosis not present

## 2014-04-23 DIAGNOSIS — E1310 Other specified diabetes mellitus with ketoacidosis without coma: Secondary | ICD-10-CM | POA: Diagnosis not present

## 2014-04-23 LAB — CBC
HCT: 33.8 % — ABNORMAL LOW (ref 39.0–52.0)
Hemoglobin: 10.7 g/dL — ABNORMAL LOW (ref 13.0–17.0)
MCH: 27.9 pg (ref 26.0–34.0)
MCHC: 31.7 g/dL (ref 30.0–36.0)
MCV: 88.3 fL (ref 78.0–100.0)
PLATELETS: 213 10*3/uL (ref 150–400)
RBC: 3.83 MIL/uL — AB (ref 4.22–5.81)
RDW: 14.9 % (ref 11.5–15.5)
WBC: 10.5 10*3/uL (ref 4.0–10.5)

## 2014-04-23 LAB — PROTIME-INR
INR: 1.05 (ref 0.00–1.49)
Prothrombin Time: 13.8 seconds (ref 11.6–15.2)

## 2014-04-23 NOTE — Progress Notes (Signed)
Name: Barry Zimmerman MRN: [id number] DOB: May 09, 1945    ADMISSION DATE:  04/17/2014 CONSULTATION DATE:  4/2  REFERRING MD :  [facility]  CHIEF COMPLAINT:  SOB  BRIEF PATIENT DESCRIPTION Mr. Barry Zimmerman is a 69 yo with a plethora of health problems who was at [place] care clinic [place] On approximately 3/26 when he felt swimmy headed. Taken to local hospital and treated for HCAP, sepsis, ams. He required intubated and was then extubated 3/31 and promptly failed , vomiting on bipap and required reintubation. Transferred to [facility] 4/1 on full vent support, diprivan drip for sedation and abx. PCCM called for ventilator management 4/2.  SIGNIFICANT EVENTS / studies 4/1 tx to ssh 4/4- [place] chest- bilat mod effusions, with atx left 4/7 trach (df)  VITAL SIGNS: Vital signs reviewed. Abnormal values will appear under impression plan section.   PHYSICAL EXAMINATION: General:  More agitated this am   Neuro: awake, agotation increased, rass 1 HEENT:  jvd wnl NGT with tube feeds off Cardiovascular:  s1 s2 RRR  Lungs: coarse bases Abdomen:  +BS SOFT no r/g Musculoskeletal:  rT BKA   Skin:  Warm   Recent Labs Lab 04/19/14 0539 04/20/14 0525 04/22/14 0530  NA 150* 146* 146*  K 4.5 4.9 4.9  CL 106 101 104  CO2 32 31 29  BUN 41* 45* 52*  CREATININE 1.47* 1.64* 1.80*  GLUCOSE 150* 129* 152*    Recent Labs Lab 04/20/14 0525 04/22/14 0530 04/23/14 0910  HGB 7.8* 7.3* 10.7*  HCT 25.2* 24.2* 33.8*  WBC 10.7* 7.7 10.5  PLT 171 198 213   Dg Chest Port 1 View  04/23/2014   CLINICAL DATA:  New tracheostomy tube.  EXAM: PORTABLE CHEST - 1 VIEW  COMPARISON:  Single view of the chest 04/22/2014.  FINDINGS: A new tracheostomy tube is in place for the previously seen endotracheal tube. The new tube is well-positioned. Right PICC and NG tube are again noted. Bilateral effusions and basilar airspace disease persist but appear improved. There is no pneumothorax.  IMPRESSION: New tracheostomy tube  projects in good position.  Bilateral pleural effusions and basilar airspace disease appear improved.   Electronically Signed   By: Barry Zimmerman M.D.   On: 04/23/2014 12:45   Dg Chest Port 1 View  04/22/2014   CLINICAL DATA:  Sepsis, pneumonia, CVA ; intubated patient.  EXAM: PORTABLE CHEST - 1 VIEW  COMPARISON:  Portable chest x-ray of April 19, 2014  FINDINGS: The lungs are adequately inflated. There are bilateral pleural effusions greater on the right than on the left. The retrocardiac region remains dense. The cardiac silhouette is top-normal in size and stable. There is engorgement of the central pulmonary vascularity.  The endotracheal tube tip lies 4 cm above the carina. The esophagogastric tube projects below the inferior margin of the image. The right-sided PICC line tip projects over the middle third of the SVC.  IMPRESSION: Fairly stable appearance of the chest since yesterday's study. There are persistent bilateral pleural effusions, left lower lobe atelectasis, and mild pulmonary vascular congestion.   Electronically Signed   By: Barry  Zimmerman   On: 04/22/2014 07:37    ASSESSMENT     Ventilator dependence   Acute on chronic respiratory failure   HCAP (healthcare-associated pneumonia)   Septic shock -resolved   DM (diabetes mellitus) type 2, uncontrolled, with ketoacidosis   CVA (cerebral vascular accident)   AKI (acute kidney injury) on CKD   Hypernatremia  Encephalopathy, metabolic   Hx of right BKA   Leg ulcer, left   FTT (failure to thrive) in adult  S/p Trach 4/7 Effusion Worsening renal fxn  PLAN: Consider pos balance and follow crt trend S/p trach, would allow to wake up then re reduce rate to prior rate Wean s/p trach still with cpap ps 10-12  Would not escalate to trach collar as of now with such prior slow progress on ett If not improved weaning by weekends end, will consider thoracentesis Pct unimpressive Plan will be dc sutures in 1 week No lasix, crt in am  needed   Barry Rossettianiel J. Tyson AliasFeinstein, MD, FACP Pgr: [phone] [place] [facility]

## 2014-04-23 NOTE — Procedures (Signed)
Bronchoscopy  for Percutaneous  Tracheostomy  Name: Barry GavelWalter Zimmerman MRN: [id number] DOB: November 16, 1945 Procedure: Bronchoscopy for Percutaneous Tracheostomy Indications: Diagnostic evaluation of the airways In conjunction with: Dr. Tyson AliasFeinstein   Procedure Details Consent: Risks of procedure as well as the alternatives and risks of each were explained to the (patient/caregiver).  Consent for procedure obtained. Time Out: Verified patient identification, verified procedure, site/side was marked, verified correct patient position, special equipment/implants available, medications/allergies/relevent history reviewed, required imaging and test results available.  Performed  In preparation for procedure, patient was given 100% FiO2 and bronchoscope lubricated. Sedation: Etomidate  Procedures performed: Endotracheal Tube retracted in 2 cm increments. Cannulation of airway observed. Dilation observed. Placement of trachel tube  observed . No overt complications. Bronchoscope removed.    Evaluation Hemodynamic Status: BP stable throughout; O2 sats: stable throughout Patient's Current Condition: stable Complications: No apparent complications Patient did tolerate procedure well.  Joneen RoachPaul Hoffman, AGACNP-BC [place] [facility] Pager [phone] or [phone]  I was present and supervised the entire procedure.  Alyson ReedyWesam G. Clary Meeker, M.D. [facility]. Pager: [phone]. After hours pager: [phone].  04/23/2014 11:33 AM

## 2014-04-23 NOTE — Procedures (Signed)
Name:  Barry GavelWalter Kehoe MRN:  [id number] DOB:  1945/08/10  OPERATIVE NOTE  Procedure:  Percutaneous tracheostomy.  Indications:  Ventilator-dependent respiratory failure.  Consent:  Procedure, alternatives, risks and benefits discussed with medical POA.  Questions answered.  Consent obtained.  Anesthesia:  Versed, fent, etom, vec.  Procedure summary:  Appropriate equipment was assembled.  The patient was identified as Barry Zimmerman and safety timeout was performed. The patient was placed in supine position with a towel roll behind shoulder blades and neck extended.  Sterile technique was used. The patient's neck and upper chest were prepped using chlorhexidine / alcohol scrub and the field was draped in usual sterile fashion with full body drape. After the adequate sedation / anesthesia was achieved, attention was directed at the midline trachea, where the cricothyroid membrane was palpated. Approximately two fingerbreadths above the sternal notch, a 1 cm verticle incision was created with a scalpel after local infiltration with 0.2% Lidocaine. Then, using Seldinger technique and a percutaneous tracheostomy set, the trachea was entered with a 14 gauge needle with an overlying sheath. This was all confirmed under direct visualization of a fiberoptic flexible bronchoscope. Entrance into the trachea was identified through the third tracheal ring interspace. Following this, a guidewire was inserted. The needle was removed, leaving the sheath and the guidewire intact. Next, the sheath was removed and a small dilator was inserted. The tracheal rings were then dilated. A #6 Shiley was then opened. The balloon was checked. It was placed over a tracheal dilator, which was then advanced over the guidewire and through the previously dilated tract. The Shiley tracheostomy tube was noted to pass in the trachea with little resistance. The guidewire and dilator tubes were removed from the trachea. An inner cannula was  placed through the tracheostomy tube. The tracheostomy was then secured at the anterior neck with 4 monofilament sutures. The oral endotracheal tube was removed and the ventilator was attached to the newly placed tracheostomy tube. Adequate tidal volumes were noted. The cuff was inflated and no evidence of air leak was noted. No evidence of bleeding was noted. At this point, the procedure was concluded. Post-procedure chest x-ray was ordered.  Complications:  No immediate complications were noted.  Hemodynamic parameters and oxygenation remained stable throughout the procedure.  Estimated blood loss:  Less then 1 mL.  Nelda BucksFEINSTEIN,DANIEL J., MD Pulmonary and Critical Care Medicine [facility] Pager: [phone]  04/23/2014, 12:32 PM  Can come to trach clinic [phone]

## 2014-04-23 NOTE — Procedures (Signed)
Bedside Tracheostomy Insertion Procedure Note   Patient Details:   Name: Barry Zimmerman DOB: 08-26-1945 MRN: [id number]  Procedure: Tracheostomy  Pre Procedure Assessment: ET Tube Size: 7.5 ET Tube secured at lip (cm): 25 Bite block in place: No Breath Sounds: Clear  Post Procedure Assessment: There were no vitals taken for this visit. O2 sats: stable throughout Complications: No apparent complications Patient did tolerate procedure well Tracheostomy Brand:Shiley Tracheostomy Style:Cuffed Tracheostomy Size: 6 Tracheostomy Secured AOZ:HYQMVHQvia:Sutures, velcro Tracheostomy Placement Confirmation:Trach cuff visualized and in place and Chest X ray ordered for placement    Jacqulynn CadetHopper, Pearlina Friedly David 04/23/2014, 11:40 AM

## 2014-04-24 LAB — TYPE AND SCREEN
ABO/RH(D): O POS
Antibody Screen: NEGATIVE
UNIT DIVISION: 0
Unit division: 0

## 2014-04-24 LAB — BASIC METABOLIC PANEL
Anion gap: 4 — ABNORMAL LOW (ref 5–15)
BUN: 58 mg/dL — ABNORMAL HIGH (ref 6–23)
CALCIUM: 9.4 mg/dL (ref 8.4–10.5)
CO2: 33 mmol/L — ABNORMAL HIGH (ref 19–32)
CREATININE: 1.29 mg/dL (ref 0.50–1.35)
Chloride: 103 mmol/L (ref 96–112)
GFR calc Af Amer: 63 mL/min — ABNORMAL LOW (ref >90)
GFR calc non Af Amer: 55 mL/min — ABNORMAL LOW (ref >90)
GLUCOSE: 180 mg/dL — AB (ref 70–99)
Potassium: 5.4 mmol/L — ABNORMAL HIGH (ref 3.5–5.1)
SODIUM: 140 mmol/L (ref 135–145)

## 2014-04-24 LAB — CULTURE, BLOOD (ROUTINE X 2)
CULTURE: NO GROWTH
Culture: NO GROWTH

## 2014-04-25 LAB — CBC
HCT: 32.3 % — ABNORMAL LOW (ref 39.0–52.0)
HEMOGLOBIN: 10.2 g/dL — AB (ref 13.0–17.0)
MCH: 27.9 pg (ref 26.0–34.0)
MCHC: 31.6 g/dL (ref 30.0–36.0)
MCV: 88.3 fL (ref 78.0–100.0)
Platelets: 226 10*3/uL (ref 150–400)
RBC: 3.66 MIL/uL — AB (ref 4.22–5.81)
RDW: 15.3 % (ref 11.5–15.5)
WBC: 8.7 10*3/uL (ref 4.0–10.5)

## 2014-04-25 LAB — POTASSIUM: POTASSIUM: 4.6 mmol/L (ref 3.5–5.1)

## 2014-04-27 ENCOUNTER — Other Ambulatory Visit (HOSPITAL_COMMUNITY): Payer: Medicare Other

## 2014-04-27 DIAGNOSIS — J9620 Acute and chronic respiratory failure, unspecified whether with hypoxia or hypercapnia: Secondary | ICD-10-CM | POA: Diagnosis not present

## 2014-04-27 DIAGNOSIS — R1010 Upper abdominal pain, unspecified: Secondary | ICD-10-CM | POA: Diagnosis not present

## 2014-04-27 DIAGNOSIS — I639 Cerebral infarction, unspecified: Secondary | ICD-10-CM | POA: Diagnosis not present

## 2014-04-27 DIAGNOSIS — R109 Unspecified abdominal pain: Secondary | ICD-10-CM | POA: Insufficient documentation

## 2014-04-27 DIAGNOSIS — N179 Acute kidney failure, unspecified: Secondary | ICD-10-CM | POA: Diagnosis not present

## 2014-04-27 LAB — BASIC METABOLIC PANEL
ANION GAP: 13 (ref 5–15)
BUN: 59 mg/dL — ABNORMAL HIGH (ref 6–23)
CHLORIDE: 93 mmol/L — AB (ref 96–112)
CO2: 30 mmol/L (ref 19–32)
Calcium: 9 mg/dL (ref 8.4–10.5)
Creatinine, Ser: 1.11 mg/dL (ref 0.50–1.35)
GFR calc Af Amer: 76 mL/min — ABNORMAL LOW (ref >90)
GFR, EST NON AFRICAN AMERICAN: 65 mL/min — AB (ref >90)
Glucose, Bld: 303 mg/dL — ABNORMAL HIGH (ref 70–99)
Potassium: 5.2 mmol/L — ABNORMAL HIGH (ref 3.5–5.1)
SODIUM: 136 mmol/L (ref 135–145)

## 2014-04-27 LAB — BLOOD GAS, ARTERIAL
ACID-BASE EXCESS: 5.1 mmol/L — AB (ref 0.0–2.0)
BICARBONATE: 29.4 meq/L — AB (ref 20.0–24.0)
FIO2: 0.28 %
MODE: POSITIVE
O2 SAT: 97.7 %
PEEP: 5 cmH2O
Patient temperature: 98.6
Pressure support: 8 cmH2O
TCO2: 30.8 mmol/L (ref 0–100)
pCO2 arterial: 46 mmHg — ABNORMAL HIGH (ref 35.0–45.0)
pH, Arterial: 7.422 (ref 7.350–7.450)
pO2, Arterial: 97.3 mmHg (ref 80.0–100.0)

## 2014-04-27 LAB — PHOSPHORUS: PHOSPHORUS: 3.2 mg/dL (ref 2.3–4.6)

## 2014-04-27 LAB — MAGNESIUM: MAGNESIUM: 1.9 mg/dL (ref 1.5–2.5)

## 2014-04-27 NOTE — Progress Notes (Signed)
   Name: Barry Zimmerman MRN: [id number] DOB: 03-02-1945    ADMISSION DATE:  04/17/2014 CONSULTATION DATE:  4/2  [facility] :  [facility]  CHIEF COMPLAINT:  SOB  BRIEF PATIENT DESCRIPTION Mr. Barry Zimmerman is a 69 yo with a plethora of health problems who [place] On approximately 3/26 when he felt swimmy headed. Taken to local hospital and treated for HCAP, sepsis, ams. He required intubated and was then extubated 3/31 and promptly failed , vomiting on bipap and required reintubation. [facility], diprivan drip for sedation and abx. PCCM called for ventilator management 4/2.  SIGNIFICANT EVENTS / studies 4/1 tx to ssh 4/4- US chest- bilat mod effusions, with atx left 4/7 trach (df)  VITAL SIGNS: Vital signs reviewed. Abnormal values will appear under impression plan section.   PHYSICAL EXAMINATION: General:  Awake, calm Neuro: awake, moves all ext, rass 1 HEENT:  jvd wnl, trach clean NGT feeds Cardiovascular:  s1 s2 RRR  Lungs: coarse bases resolving, cta anterior Abdomen:  +BS SOFT no r/g Musculoskeletal:  rT BKA   Skin:  Warm   Recent Labs Lab 04/22/14 0530 04/24/14 0620 04/25/14 0704  NA 146* 140  --   K 4.9 5.4* 4.6  CL 104 103  --   CO2 29 33*  --   BUN 52* 58*  --   CREATININE 1.80* 1.29  --   GLUCOSE 152* 180*  --     Recent Labs Lab 04/22/14 0530 04/23/14 0910 04/25/14 0704  HGB 7.3* 10.7* 10.2*  HCT 24.2* 33.8* 32.3*  WBC 7.7 10.5 8.7  PLT 198 213 226   No results found.  ASSESSMENT     Ventilator dependence   Acute on chronic respiratory failure   HCAP (healthcare-associated pneumonia)   Septic shock -resolved   DM (diabetes mellitus) type 2, uncontrolled, with ketoacidosis   CVA (cerebral vascular accident)   AKI (acute kidney injury) on CKD   Hypernatremia   Encephalopathy, metabolic   Hx of right BKA   Leg ulcer, left   FTT (failure to thrive) in adult  S/p Trach  4/7 Effusion  PLAN: He has done PS 12, attempt reduction to PS 8-10, if successful then to trach collar in am  I have some reservations on going to trach collar if we have had limited success on PS12 and no lower Get pcxr now assess volume status Get bmet now as remains on active diuresis for days.had bumped his crt in past easily on diuresis If to trach collar in am would go for tid sprints  Mcarthur RossettiDaniel J. Zimmerman AliasFeinstein, MD, FACP Pgr: [phone] [facility]

## 2014-04-28 ENCOUNTER — Other Ambulatory Visit (HOSPITAL_COMMUNITY): Payer: Medicare Other

## 2014-04-28 LAB — COMPREHENSIVE METABOLIC PANEL
ALK PHOS: 58 U/L (ref 39–117)
ALT: 27 U/L (ref 0–53)
AST: 26 U/L (ref 0–37)
Albumin: 3.3 g/dL — ABNORMAL LOW (ref 3.5–5.2)
Anion gap: 11 (ref 5–15)
BUN: 60 mg/dL — AB (ref 6–23)
CO2: 34 mmol/L — ABNORMAL HIGH (ref 19–32)
CREATININE: 1.1 mg/dL (ref 0.50–1.35)
Calcium: 9.3 mg/dL (ref 8.4–10.5)
Chloride: 91 mmol/L — ABNORMAL LOW (ref 96–112)
GFR, EST AFRICAN AMERICAN: 77 mL/min — AB (ref >90)
GFR, EST NON AFRICAN AMERICAN: 66 mL/min — AB (ref >90)
Glucose, Bld: 144 mg/dL — ABNORMAL HIGH (ref 70–99)
POTASSIUM: 4.3 mmol/L (ref 3.5–5.1)
Sodium: 136 mmol/L (ref 135–145)
Total Bilirubin: 0.5 mg/dL (ref 0.3–1.2)
Total Protein: 6.9 g/dL (ref 6.0–8.3)

## 2014-04-28 LAB — CBC
HEMATOCRIT: 30.9 % — AB (ref 39.0–52.0)
Hemoglobin: 9.7 g/dL — ABNORMAL LOW (ref 13.0–17.0)
MCH: 27.2 pg (ref 26.0–34.0)
MCHC: 31.4 g/dL (ref 30.0–36.0)
MCV: 86.8 fL (ref 78.0–100.0)
PLATELETS: 295 10*3/uL (ref 150–400)
RBC: 3.56 MIL/uL — AB (ref 4.22–5.81)
RDW: 15.1 % (ref 11.5–15.5)
WBC: 12.2 10*3/uL — ABNORMAL HIGH (ref 4.0–10.5)

## 2014-04-29 ENCOUNTER — Other Ambulatory Visit (HOSPITAL_COMMUNITY): Payer: Medicare Other

## 2014-04-30 DIAGNOSIS — Z930 Tracheostomy status: Secondary | ICD-10-CM | POA: Diagnosis not present

## 2014-04-30 DIAGNOSIS — J9620 Acute and chronic respiratory failure, unspecified whether with hypoxia or hypercapnia: Secondary | ICD-10-CM | POA: Diagnosis not present

## 2014-04-30 NOTE — Progress Notes (Signed)
Name: Barry Zimmerman MRN: [id number] DOB: 04/27/1945    ADMISSION DATE:  04/17/2014 CONSULTATION DATE:  4/2  [facility]  CHIEF COMPLAINT:  SOB  BRIEF PATIENT DESCRIPTION Barry Zimmerman is a 69 yo with a plethora of health problems who was [place] in [place] On approximately 3/26 when he felt swimmy headed. Taken to local hospital and treated for HCAP, sepsis, ams. He required intubated and was then extubated 3/31 and promptly failed , vomiting on bipap and required reintubation. [facility], diprivan drip for sedation and abx. PCCM called for ventilator management 4/2.  SIGNIFICANT EVENTS / studies 4/1 tx to ssh 4/4- US chest- bilat mod effusions, with atx left 4/7 trach (df) 4/14 on t collar with PMV VITAL SIGNS: Vital signs reviewed. Abnormal values will appear under impression plan section.   PHYSICAL EXAMINATION: General:  Awake, calm, speech clear Neuro: awake, moves all ext, rass 1 HEENT:  jvd wnl, trach clean, pmv in place NGT feeds Cardiovascular:  s1 s2 RRR  Lungs: coarse bases , cta anterior Abdomen:  +BS SOFT no r/g Musculoskeletal:  Rt BKA   Skin:  Warm   Recent Labs Lab 04/24/14 0620 04/25/14 0704 04/27/14 1610 04/28/14 0548  NA 140  --  136 136  K 5.4* 4.6 5.2* 4.3  CL 103  --  93* 91*  CO2 33*  --  30 34*  BUN 58*  --  59* 60*  CREATININE 1.29  --  1.11 1.10  GLUCOSE 180*  --  303* 144*    Recent Labs Lab 04/23/14 0910 04/25/14 0704 04/28/14 0548  HGB 10.7* 10.2* 9.7*  HCT 33.8* 32.3* 30.9*  WBC 10.5 8.7 12.2*  PLT 213 226 295   Dg Abd Portable 1v  04/29/2014   CLINICAL DATA:  Abdominal pain with distention and constipation  EXAM: PORTABLE ABDOMEN - 1 VIEW  COMPARISON:  April 27, 2014  FINDINGS: Nasogastric tube tip and side port are in the stomach. Stomach is mildly distended with air. There is moderate stool in the colon. There is no small or large bowel dilatation or air-fluid  level. No free air. There is consolidation in the left lung base.  IMPRESSION: Nasogastric tube tip and side port in stomach. Stomach mildly distended with air. Small and large bowel appear unremarkable. No obstruction or free air. Left lung base consolidation.   Electronically Signed   By: Bretta BangWilliam  Woodruff III M.D.   On: 04/29/2014 16:51    ASSESSMENT     Ventilator dependence   Acute on chronic respiratory failure   HCAP (healthcare-associated pneumonia)   Septic shock -resolved   DM (diabetes mellitus) type 2   CVA (cerebral vascular accident)   AKI (acute kidney injury) on CKD   Hypernatremia   Encephalopathy, metabolic   Hx of right BKA   Leg ulcer, left   FTT (failure to thrive) in adult  S/p Trach 4/7 Effusion  PLAN: Currently on PMV/t collar. Wean per protocol PCCM will see weekly on Mondays.  Brett CanalesSteve Minor ACNP [facility] Pager [phone] till 3 pm If no answer page [phone] 04/30/2014, 8:17 AM   Attending Note:  I have examined patient, reviewed labs, studies and notes. I have discussed the case with S Minor, and I agree with the data and plans as amended above.   Levy Pupaobert Jeric Slagel, MD, PhD 04/30/2014, 11:04 AM [facility] [phone] or if no  answer [phone]

## 2014-05-01 LAB — CBC
HCT: 35.6 % — ABNORMAL LOW (ref 39.0–52.0)
HEMOGLOBIN: 11.3 g/dL — AB (ref 13.0–17.0)
MCH: 27.3 pg (ref 26.0–34.0)
MCHC: 31.7 g/dL (ref 30.0–36.0)
MCV: 86 fL (ref 78.0–100.0)
Platelets: 316 10*3/uL (ref 150–400)
RBC: 4.14 MIL/uL — ABNORMAL LOW (ref 4.22–5.81)
RDW: 14.3 % (ref 11.5–15.5)
WBC: 11 10*3/uL — ABNORMAL HIGH (ref 4.0–10.5)

## 2014-05-04 ENCOUNTER — Other Ambulatory Visit (HOSPITAL_COMMUNITY): Payer: Medicare Other

## 2014-05-04 DIAGNOSIS — J9620 Acute and chronic respiratory failure, unspecified whether with hypoxia or hypercapnia: Secondary | ICD-10-CM | POA: Diagnosis not present

## 2014-05-04 DIAGNOSIS — Z930 Tracheostomy status: Secondary | ICD-10-CM | POA: Diagnosis not present

## 2014-05-04 LAB — CBC WITH DIFFERENTIAL/PLATELET
BASOS ABS: 0 10*3/uL (ref 0.0–0.1)
BASOS PCT: 0 % (ref 0–1)
EOS ABS: 0.6 10*3/uL (ref 0.0–0.7)
EOS PCT: 4 % (ref 0–5)
HEMATOCRIT: 30.1 % — AB (ref 39.0–52.0)
HEMOGLOBIN: 9.6 g/dL — AB (ref 13.0–17.0)
LYMPHS PCT: 12 % (ref 12–46)
Lymphs Abs: 1.6 10*3/uL (ref 0.7–4.0)
MCH: 27.8 pg (ref 26.0–34.0)
MCHC: 31.9 g/dL (ref 30.0–36.0)
MCV: 87.2 fL (ref 78.0–100.0)
MONO ABS: 1.1 10*3/uL — AB (ref 0.1–1.0)
MONOS PCT: 8 % (ref 3–12)
Neutro Abs: 10.7 10*3/uL — ABNORMAL HIGH (ref 1.7–7.7)
Neutrophils Relative %: 76 % (ref 43–77)
Platelets: 276 10*3/uL (ref 150–400)
RBC: 3.45 MIL/uL — ABNORMAL LOW (ref 4.22–5.81)
RDW: 14.4 % (ref 11.5–15.5)
WBC: 14 10*3/uL — ABNORMAL HIGH (ref 4.0–10.5)

## 2014-05-04 LAB — BASIC METABOLIC PANEL
ANION GAP: 15 (ref 5–15)
BUN: 64 mg/dL — ABNORMAL HIGH (ref 6–23)
CHLORIDE: 88 mmol/L — AB (ref 96–112)
CO2: 31 mmol/L (ref 19–32)
Calcium: 9.1 mg/dL (ref 8.4–10.5)
Creatinine, Ser: 1.41 mg/dL — ABNORMAL HIGH (ref 0.50–1.35)
GFR calc non Af Amer: 49 mL/min — ABNORMAL LOW (ref >90)
GFR, EST AFRICAN AMERICAN: 57 mL/min — AB (ref >90)
GLUCOSE: 199 mg/dL — AB (ref 70–99)
POTASSIUM: 4.3 mmol/L (ref 3.5–5.1)
Sodium: 134 mmol/L — ABNORMAL LOW (ref 135–145)

## 2014-05-04 LAB — MAGNESIUM: Magnesium: 1.8 mg/dL (ref 1.5–2.5)

## 2014-05-04 LAB — CULTURE, RESPIRATORY W GRAM STAIN

## 2014-05-04 LAB — PROCALCITONIN: Procalcitonin: 0.18 ng/mL

## 2014-05-04 LAB — CULTURE, RESPIRATORY

## 2014-05-04 LAB — TSH: TSH: 10.267 u[IU]/mL — AB (ref 0.350–4.500)

## 2014-05-04 NOTE — Progress Notes (Signed)
Name: Garner GavelWalter Mensch MRN: [id number] DOB: October 12, 1945    ADMISSION DATE:  04/17/2014 CONSULTATION DATE:  4/2  [facility]  CHIEF COMPLAINT:  SOB  BRIEF PATIENT DESCRIPTION Mr. Clemon ChambersGiles is a 69 yo with a plethora of health problems who was [place] On approximately 3/26 when he felt swimmy headed. Taken to local hospital and treated for HCAP, sepsis, ams. He required intubated and was then extubated 3/31 and promptly failed , vomiting on bipap and required reintubation. [facility], diprivan drip for sedation and abx. PCCM called for ventilator management 4/2.  SIGNIFICANT EVENTS / studies 4/01  [facility]  US chest- bilat mod effusions, with atx left 4/07  trach (df) 4/14  on t collar with PMV   VITAL SIGNS: Vital signs reviewed. Abnormal values will appear under impression plan section.   PHYSICAL EXAMINATION: General:  Awake, calm, speech clear Neuro: awake, moves all ext, confused but pleasant HEENT:  jvd wnl, trach clean, pmv in place, NGT with TF Cardiovascular:  s1 s2 RRR  Lungs: even/non-labored, lungs bilaterally coarse Abdomen:  +BS SOFT no r/g Musculoskeletal:  Rt BKA   Skin:  Warm   Recent Labs Lab 04/27/14 1610 04/28/14 0548 05/04/14 0603  NA 136 136 134*  K 5.2* 4.3 4.3  CL 93* 91* 88*  CO2 30 34* 31  BUN 59* 60* 64*  CREATININE 1.11 1.10 1.41*  GLUCOSE 303* 144* 199*    Recent Labs Lab 04/28/14 0548 05/01/14 0537 05/04/14 0603  HGB 9.7* 11.3* 9.6*  HCT 30.9* 35.6* 30.1*  WBC 12.2* 11.0* 14.0*  PLT 295 316 276   Dg Chest Port 1 View  05/04/2014   CLINICAL DATA:  Respiratory failure, tracheostomy patient.  EXAM: PORTABLE CHEST - 1 VIEW  COMPARISON:  Portable chest x-ray of April 28, 2014  FINDINGS: The lungs are adequately inflated. There is persistent increased density in the retrocardiac region on the left with obscuration of the hemidiaphragm. The right infrahilar  region is clearer today. The cardiac silhouette is top-normal in size but stable. The pulmonary vascularity is normal. The tracheostomy appliance tip projects at the level of the inferior margin of the clavicular heads. The orogastric tube tip projects below the inferior margin of the image. The right-sided PICC line has been removed. The bony thorax is unremarkable.  IMPRESSION: Improving left basilar atelectasis and small pleural effusion. No significant effusion on the right is demonstrated.   Electronically Signed   By: David  SwazilandJordan   On: 05/04/2014 07:54    ASSESSMENT   Ventilator dependence Acute on chronic respiratory failure HCAP (healthcare-associated pneumonia) Tracheostomy Status (4/7) Pleural Effusion Septic shock - resolved DM (diabetes mellitus) type 2 CVA (cerebral vascular accident) AKI (acute kidney injury) on CKD Hypernatremia Encephalopathy, metabolic Hx of right BKA Leg ulcer, left FTT (failure to thrive) in adult   PLAN: Continue PMV with SLP  ATC as tolerated (currently > 48 hours) Would not decannulate until patient more mobile and mental status clears.   PCCM will sign off.  Please call if new needs arise.    Canary BrimBrandi Ollis, NP-C [facility] Pgr: [phone] or [phone]   05/04/2014, 11:40 AM  I reviewed CXR myself.  Trach in good position.  Acute on chronic respiratory failure: tolerating PMV, will place orders to cap.  Hypoxemia: titrate O2 for sats.  Pleural effusion: seen on CXR, no need  for thora, lasix as ordered.  Tracheostomy status: may proceed with capping trials but no extubation until more mobile and rehabed better.  Patient seen and examined, agree with above note.  I dictated the care and orders written for this patient under my direction.  Alyson Reedy, MD [phone]

## 2014-05-05 LAB — PROCALCITONIN: PROCALCITONIN: 0.31 ng/mL

## 2014-05-08 LAB — CBC
HCT: 32.3 % — ABNORMAL LOW (ref 39.0–52.0)
HEMOGLOBIN: 10.5 g/dL — AB (ref 13.0–17.0)
MCH: 27.5 pg (ref 26.0–34.0)
MCHC: 32.5 g/dL (ref 30.0–36.0)
MCV: 84.6 fL (ref 78.0–100.0)
PLATELETS: 238 10*3/uL (ref 150–400)
RBC: 3.82 MIL/uL — ABNORMAL LOW (ref 4.22–5.81)
RDW: 14.5 % (ref 11.5–15.5)
WBC: 16.3 10*3/uL — AB (ref 4.0–10.5)

## 2014-05-08 LAB — BASIC METABOLIC PANEL
Anion gap: 15 (ref 5–15)
BUN: 114 mg/dL — ABNORMAL HIGH (ref 6–23)
CHLORIDE: 90 mmol/L — AB (ref 96–112)
CO2: 28 mmol/L (ref 19–32)
Calcium: 9.4 mg/dL (ref 8.4–10.5)
Creatinine, Ser: 1.99 mg/dL — ABNORMAL HIGH (ref 0.50–1.35)
GFR calc Af Amer: 37 mL/min — ABNORMAL LOW (ref >90)
GFR, EST NON AFRICAN AMERICAN: 32 mL/min — AB (ref >90)
GLUCOSE: 100 mg/dL — AB (ref 70–99)
POTASSIUM: 4.2 mmol/L (ref 3.5–5.1)
SODIUM: 133 mmol/L — AB (ref 135–145)

## 2014-05-09 ENCOUNTER — Other Ambulatory Visit (HOSPITAL_COMMUNITY): Payer: Medicare Other

## 2014-05-10 LAB — BASIC METABOLIC PANEL
Anion gap: 15 (ref 5–15)
BUN: 105 mg/dL — AB (ref 6–23)
CO2: 26 mmol/L (ref 19–32)
Calcium: 9.3 mg/dL (ref 8.4–10.5)
Chloride: 93 mmol/L — ABNORMAL LOW (ref 96–112)
Creatinine, Ser: 1.79 mg/dL — ABNORMAL HIGH (ref 0.50–1.35)
GFR calc non Af Amer: 37 mL/min — ABNORMAL LOW (ref >90)
GFR, EST AFRICAN AMERICAN: 43 mL/min — AB (ref >90)
Glucose, Bld: 97 mg/dL (ref 70–99)
Potassium: 4.1 mmol/L (ref 3.5–5.1)
SODIUM: 134 mmol/L — AB (ref 135–145)

## 2014-05-12 ENCOUNTER — Other Ambulatory Visit (HOSPITAL_COMMUNITY): Payer: Medicare Other

## 2014-05-12 LAB — URINE MICROSCOPIC-ADD ON

## 2014-05-12 LAB — CBC
HCT: 31 % — ABNORMAL LOW (ref 39.0–52.0)
HEMOGLOBIN: 10.3 g/dL — AB (ref 13.0–17.0)
MCH: 27.5 pg (ref 26.0–34.0)
MCHC: 33.2 g/dL (ref 30.0–36.0)
MCV: 82.7 fL (ref 78.0–100.0)
PLATELETS: 219 10*3/uL (ref 150–400)
RBC: 3.75 MIL/uL — AB (ref 4.22–5.81)
RDW: 14.6 % (ref 11.5–15.5)
WBC: 24.3 10*3/uL — ABNORMAL HIGH (ref 4.0–10.5)

## 2014-05-12 LAB — BASIC METABOLIC PANEL
Anion gap: 12 (ref 5–15)
BUN: 94 mg/dL — AB (ref 6–23)
CALCIUM: 9.4 mg/dL (ref 8.4–10.5)
CO2: 25 mmol/L (ref 19–32)
CREATININE: 1.51 mg/dL — AB (ref 0.50–1.35)
Chloride: 95 mmol/L — ABNORMAL LOW (ref 96–112)
GFR calc Af Amer: 52 mL/min — ABNORMAL LOW (ref >90)
GFR calc non Af Amer: 45 mL/min — ABNORMAL LOW (ref >90)
Glucose, Bld: 93 mg/dL (ref 70–99)
Potassium: 3.7 mmol/L (ref 3.5–5.1)
Sodium: 132 mmol/L — ABNORMAL LOW (ref 135–145)

## 2014-05-12 LAB — GRAM STAIN: Special Requests: NORMAL

## 2014-05-12 LAB — URINALYSIS, ROUTINE W REFLEX MICROSCOPIC
BILIRUBIN URINE: NEGATIVE
Glucose, UA: NEGATIVE mg/dL
HGB URINE DIPSTICK: NEGATIVE
KETONES UR: NEGATIVE mg/dL
Nitrite: NEGATIVE
PROTEIN: NEGATIVE mg/dL
Specific Gravity, Urine: 1.011 (ref 1.005–1.030)
Urobilinogen, UA: 0.2 mg/dL (ref 0.0–1.0)
pH: 5 (ref 5.0–8.0)

## 2014-05-14 LAB — BASIC METABOLIC PANEL
Anion gap: 11 (ref 5–15)
BUN: 69 mg/dL — ABNORMAL HIGH (ref 6–23)
CO2: 24 mmol/L (ref 19–32)
CREATININE: 1.18 mg/dL (ref 0.50–1.35)
Calcium: 8.9 mg/dL (ref 8.4–10.5)
Chloride: 103 mmol/L (ref 96–112)
GFR calc Af Amer: 70 mL/min — ABNORMAL LOW (ref >90)
GFR calc non Af Amer: 61 mL/min — ABNORMAL LOW (ref >90)
Glucose, Bld: 173 mg/dL — ABNORMAL HIGH (ref 70–99)
Potassium: 4 mmol/L (ref 3.5–5.1)
Sodium: 138 mmol/L (ref 135–145)

## 2014-05-14 LAB — CBC
HEMATOCRIT: 29 % — AB (ref 39.0–52.0)
HEMOGLOBIN: 9.5 g/dL — AB (ref 13.0–17.0)
MCH: 27.8 pg (ref 26.0–34.0)
MCHC: 32.8 g/dL (ref 30.0–36.0)
MCV: 84.8 fL (ref 78.0–100.0)
Platelets: 185 10*3/uL (ref 150–400)
RBC: 3.42 MIL/uL — ABNORMAL LOW (ref 4.22–5.81)
RDW: 14.9 % (ref 11.5–15.5)
WBC: 16.8 10*3/uL — AB (ref 4.0–10.5)

## 2014-05-14 LAB — CULTURE, RESPIRATORY W GRAM STAIN

## 2014-05-14 LAB — URINE CULTURE: SPECIAL REQUESTS: NORMAL

## 2014-05-18 LAB — CULTURE, BLOOD (ROUTINE X 2)
CULTURE: NO GROWTH
Culture: NO GROWTH

## 2014-05-19 LAB — CBC
HCT: 27.1 % — ABNORMAL LOW (ref 39.0–52.0)
Hemoglobin: 8.7 g/dL — ABNORMAL LOW (ref 13.0–17.0)
MCH: 27.2 pg (ref 26.0–34.0)
MCHC: 32.1 g/dL (ref 30.0–36.0)
MCV: 84.7 fL (ref 78.0–100.0)
Platelets: 212 10*3/uL (ref 150–400)
RBC: 3.2 MIL/uL — ABNORMAL LOW (ref 4.22–5.81)
RDW: 14.9 % (ref 11.5–15.5)
WBC: 16 10*3/uL — AB (ref 4.0–10.5)

## 2014-05-19 LAB — BASIC METABOLIC PANEL
Anion gap: 11 (ref 5–15)
BUN: 28 mg/dL — AB (ref 6–20)
CALCIUM: 8.7 mg/dL — AB (ref 8.9–10.3)
CO2: 23 mmol/L (ref 22–32)
Chloride: 104 mmol/L (ref 101–111)
Creatinine, Ser: 0.75 mg/dL (ref 0.61–1.24)
GFR calc Af Amer: >60 mL/min (ref >60)
Glucose, Bld: 75 mg/dL (ref 70–99)
Potassium: 4.1 mmol/L (ref 3.5–5.1)
Sodium: 138 mmol/L (ref 135–145)

## 2014-05-24 ENCOUNTER — Other Ambulatory Visit (HOSPITAL_COMMUNITY): Payer: Medicare Other

## 2014-05-24 LAB — CBC
HCT: 26.5 % — ABNORMAL LOW (ref 39.0–52.0)
HEMOGLOBIN: 8.7 g/dL — AB (ref 13.0–17.0)
MCH: 27.7 pg (ref 26.0–34.0)
MCHC: 32.8 g/dL (ref 30.0–36.0)
MCV: 84.4 fL (ref 78.0–100.0)
PLATELETS: 236 10*3/uL (ref 150–400)
RBC: 3.14 MIL/uL — AB (ref 4.22–5.81)
RDW: 15 % (ref 11.5–15.5)
WBC: 13.9 10*3/uL — ABNORMAL HIGH (ref 4.0–10.5)

## 2014-10-05 ENCOUNTER — Encounter (INDEPENDENT_AMBULATORY_CARE_PROVIDER_SITE_OTHER): Payer: Self-pay | Admitting: Ophthalmology

## 2014-10-08 ENCOUNTER — Encounter (INDEPENDENT_AMBULATORY_CARE_PROVIDER_SITE_OTHER): Payer: Medicare Other | Admitting: Ophthalmology

## 2014-10-08 DIAGNOSIS — E11339 Type 2 diabetes mellitus with moderate nonproliferative diabetic retinopathy without macular edema: Secondary | ICD-10-CM | POA: Diagnosis not present

## 2014-10-08 DIAGNOSIS — E11359 Type 2 diabetes mellitus with proliferative diabetic retinopathy without macular edema: Secondary | ICD-10-CM | POA: Diagnosis not present

## 2014-10-08 DIAGNOSIS — H34231 Retinal artery branch occlusion, right eye: Secondary | ICD-10-CM | POA: Diagnosis not present

## 2014-10-08 DIAGNOSIS — H43813 Vitreous degeneration, bilateral: Secondary | ICD-10-CM

## 2014-10-08 DIAGNOSIS — E11319 Type 2 diabetes mellitus with unspecified diabetic retinopathy without macular edema: Secondary | ICD-10-CM

## 2015-02-11 ENCOUNTER — Ambulatory Visit (INDEPENDENT_AMBULATORY_CARE_PROVIDER_SITE_OTHER): Payer: Medicare Other | Admitting: Ophthalmology

## 2015-02-26 ENCOUNTER — Ambulatory Visit (INDEPENDENT_AMBULATORY_CARE_PROVIDER_SITE_OTHER): Payer: Medicare HMO | Admitting: Ophthalmology

## 2015-02-26 DIAGNOSIS — H43813 Vitreous degeneration, bilateral: Secondary | ICD-10-CM | POA: Diagnosis not present

## 2015-02-26 DIAGNOSIS — E11319 Type 2 diabetes mellitus with unspecified diabetic retinopathy without macular edema: Secondary | ICD-10-CM

## 2015-02-26 DIAGNOSIS — E113592 Type 2 diabetes mellitus with proliferative diabetic retinopathy without macular edema, left eye: Secondary | ICD-10-CM | POA: Diagnosis not present

## 2015-02-26 DIAGNOSIS — E113391 Type 2 diabetes mellitus with moderate nonproliferative diabetic retinopathy without macular edema, right eye: Secondary | ICD-10-CM

## 2015-02-26 DIAGNOSIS — H34231 Retinal artery branch occlusion, right eye: Secondary | ICD-10-CM | POA: Diagnosis not present

## 2016-12-14 IMAGING — CR DG ABD PORTABLE 1V
1 series · 1 of 1 positions shown · non-contrast
Comparison: None.

CLINICAL DATA: Nasogastric tube placement

EXAM:
PORTABLE ABDOMEN - 1 VIEW

[AP]
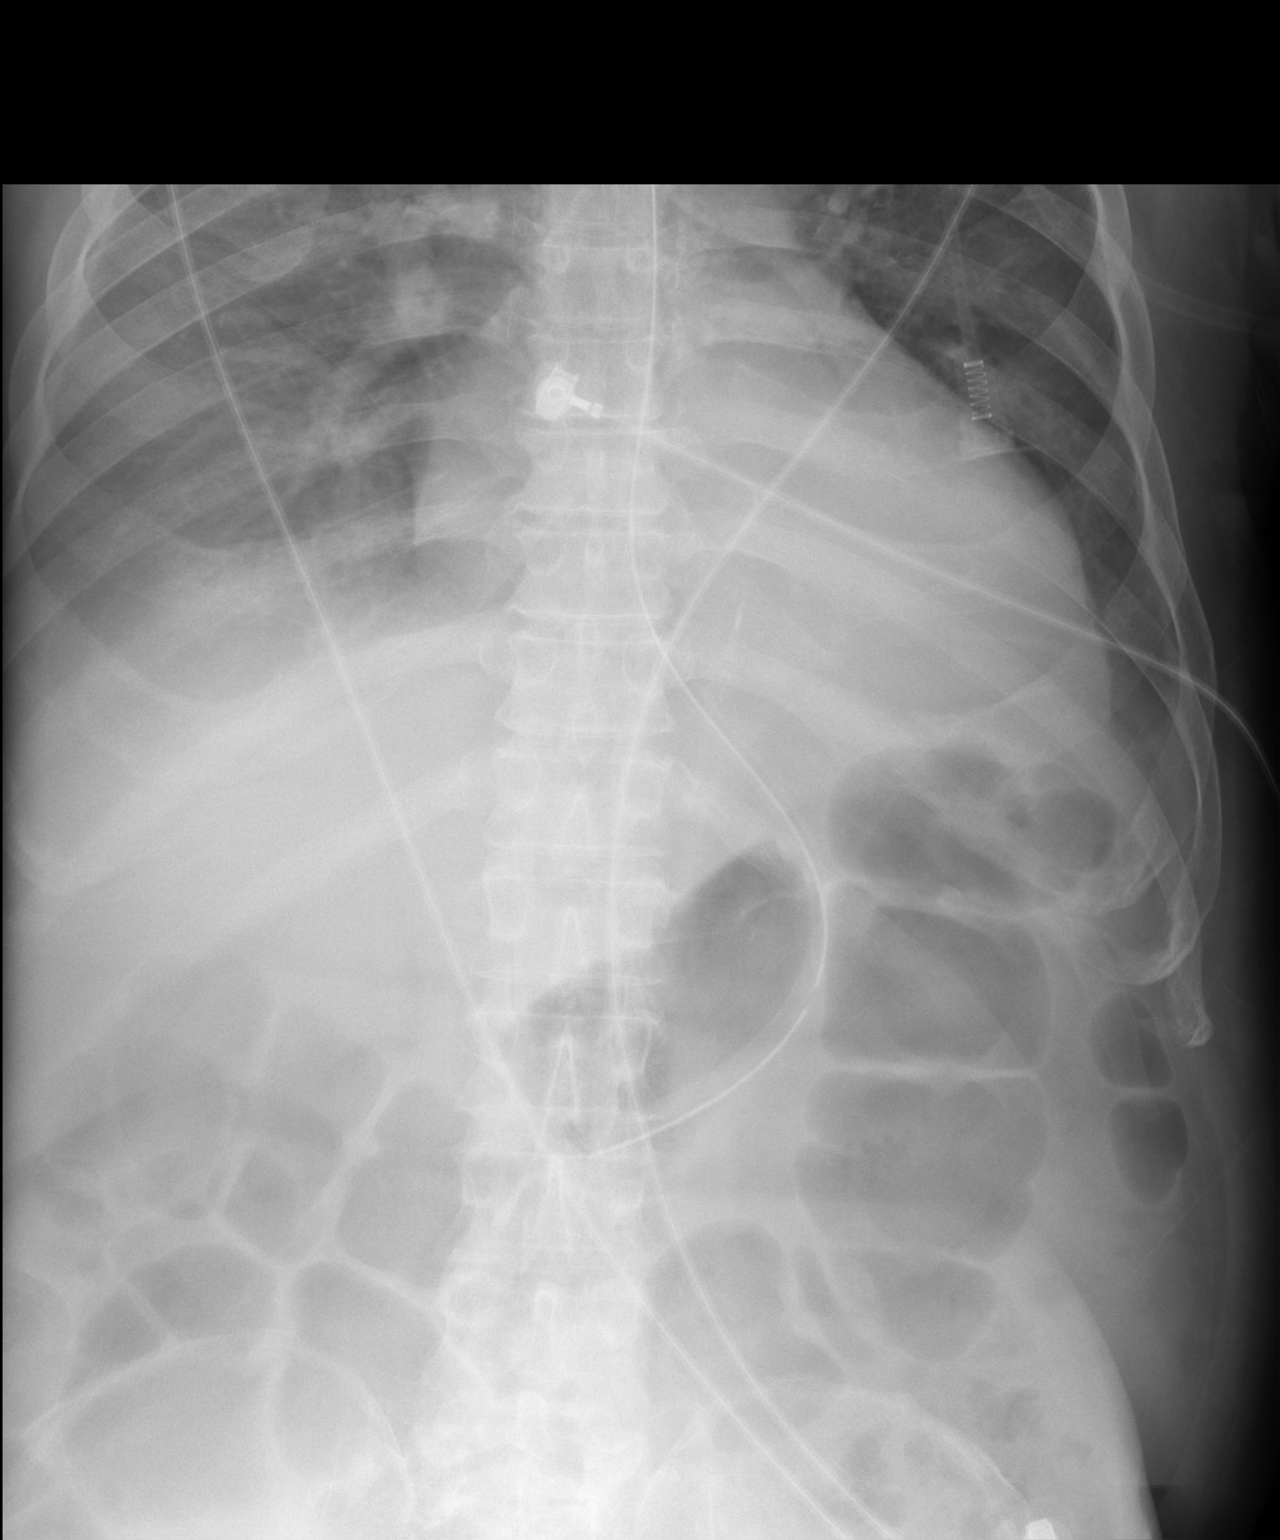

[1 of 1 positions shown; findings below may reference images not displayed]

FINDINGS: The nasogastric tube extends well into the stomach with tip in the
region of the distal gastric body.
IMPRESSION: Satisfactorily positioned nasogastric tube.

## 2016-12-14 IMAGING — CR DG CHEST 1V PORT
1 series · 1 of 1 positions shown · non-contrast
Comparison: None.

CLINICAL DATA: Central line placement

EXAM:
PORTABLE CHEST - 1 VIEW

[AP]
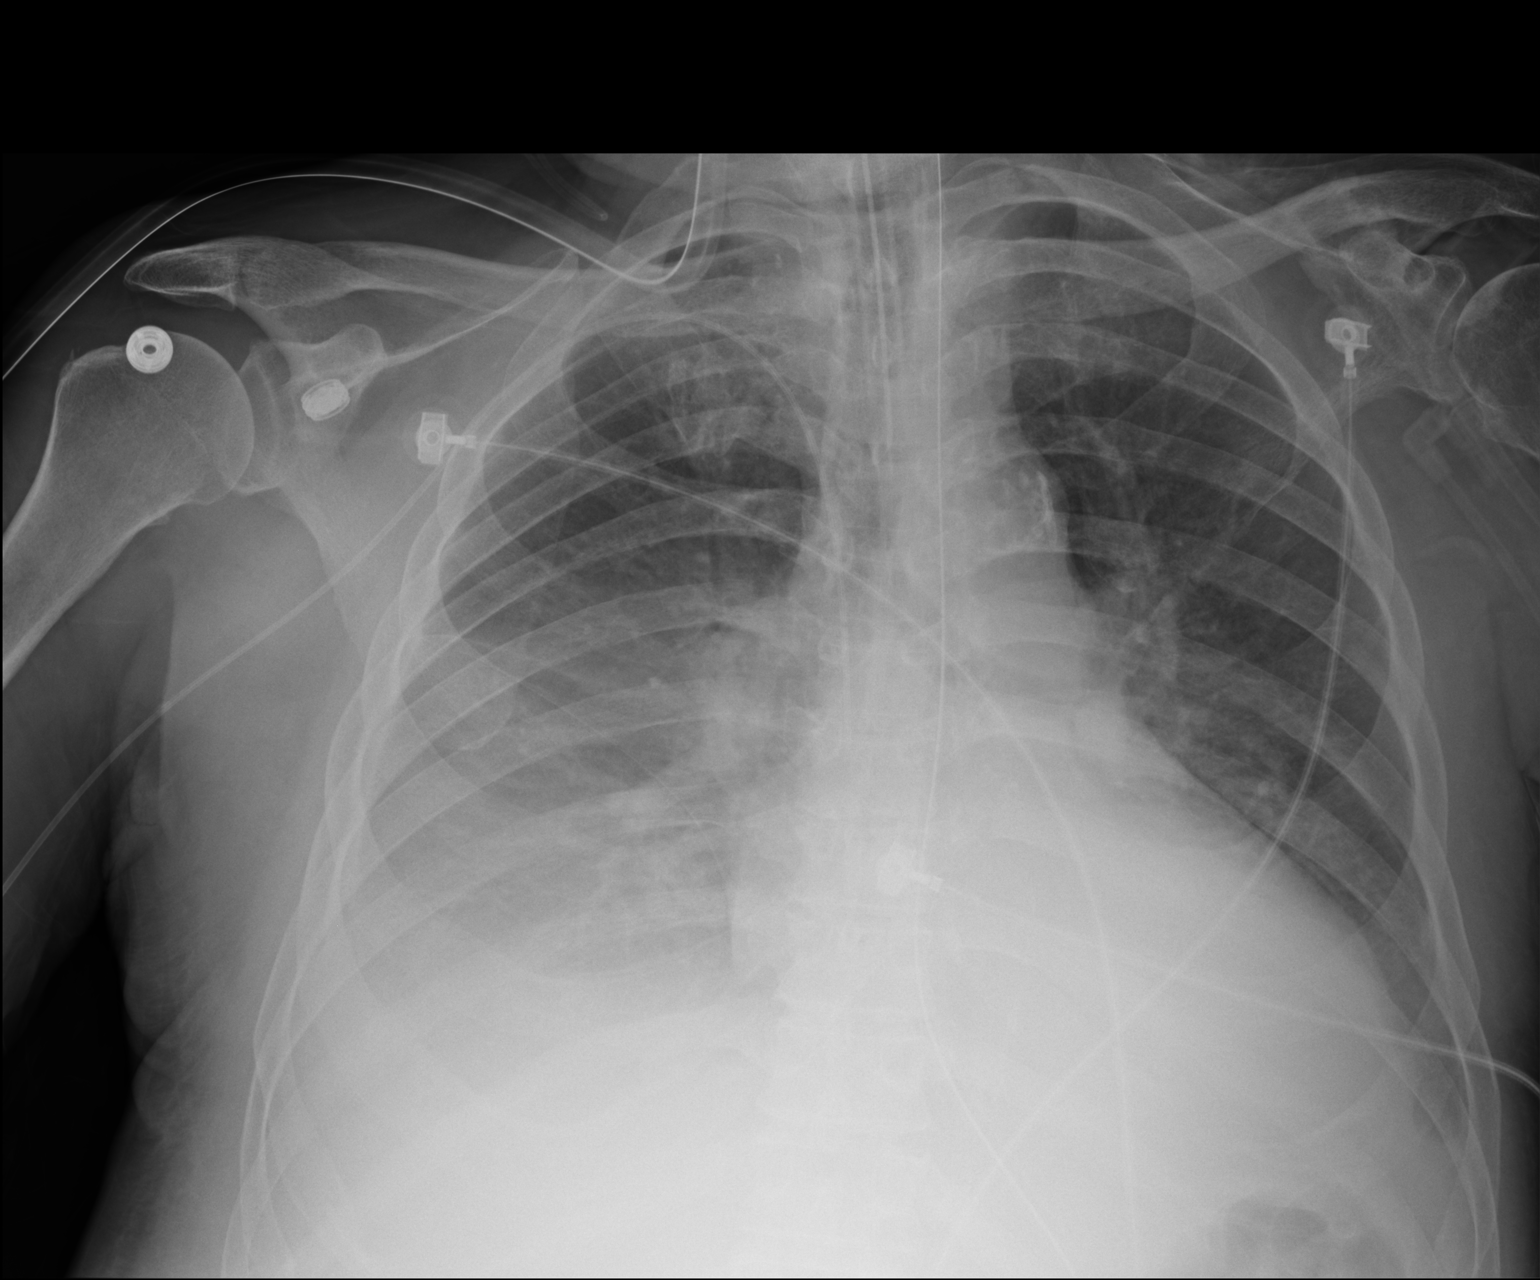

[1 of 1 positions shown; findings below may reference images not displayed]

FINDINGS: Endotracheal tube has its tip 4 cm above the carina. Nasogastric
tube enters the abdomen. Right arm PICC has its tip at the SVC RA
junction. Artifact overlies the chest. Heart size is at the upper
limits of normal. There is calcification of the thoracic aorta.
There are bilateral pleural effusions. There is volume loss in both
lower lobes.
IMPRESSION: Lines and tubes well positioned. Bilateral effusions and lower lobe
volume loss.

## 2016-12-16 IMAGING — CR DG CHEST 1V PORT
1 series · 1 of 1 positions shown · non-contrast
Comparison: 04/18/2014

CLINICAL DATA: Pneumonia.  Ventilator support.

EXAM:
PORTABLE CHEST - 1 VIEW

[AP]
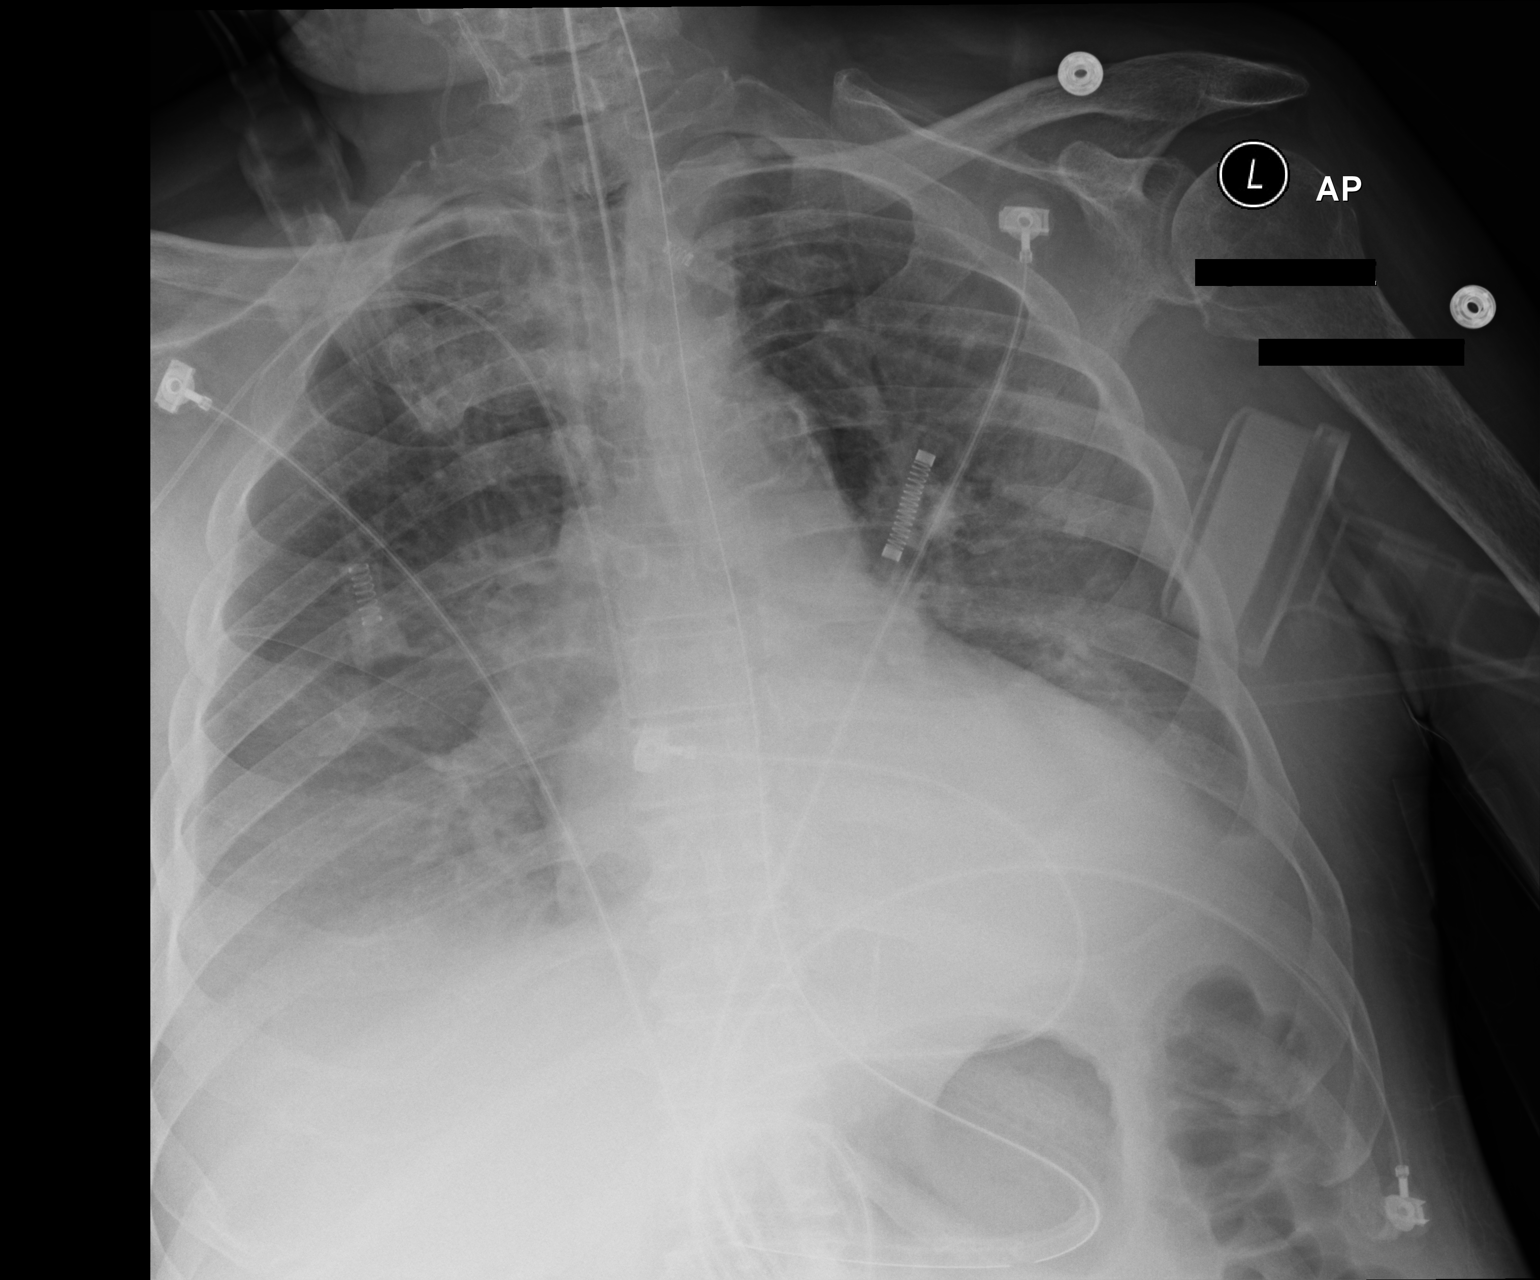

[1 of 1 positions shown; findings below may reference images not displayed]

FINDINGS: Endotracheal tube has its tip 4 cm above the carina. Nasogastric
tube enters the stomach. Right arm PICC has its tip at the SVC RA
junction. There is pulmonary edema with effusions and lower lobe
volume loss. Slight improvement since yesterday. No new findings.
IMPRESSION: Lines and tubes well positioned. Edema with effusions and lower lobe
volume loss persists.

## 2016-12-20 IMAGING — CR DG CHEST 1V PORT
1 series · 1 of 1 positions shown · non-contrast
Comparison: Single view of the chest 04/22/2014.

CLINICAL DATA: New tracheostomy tube.

EXAM:
PORTABLE CHEST - 1 VIEW

[AP]
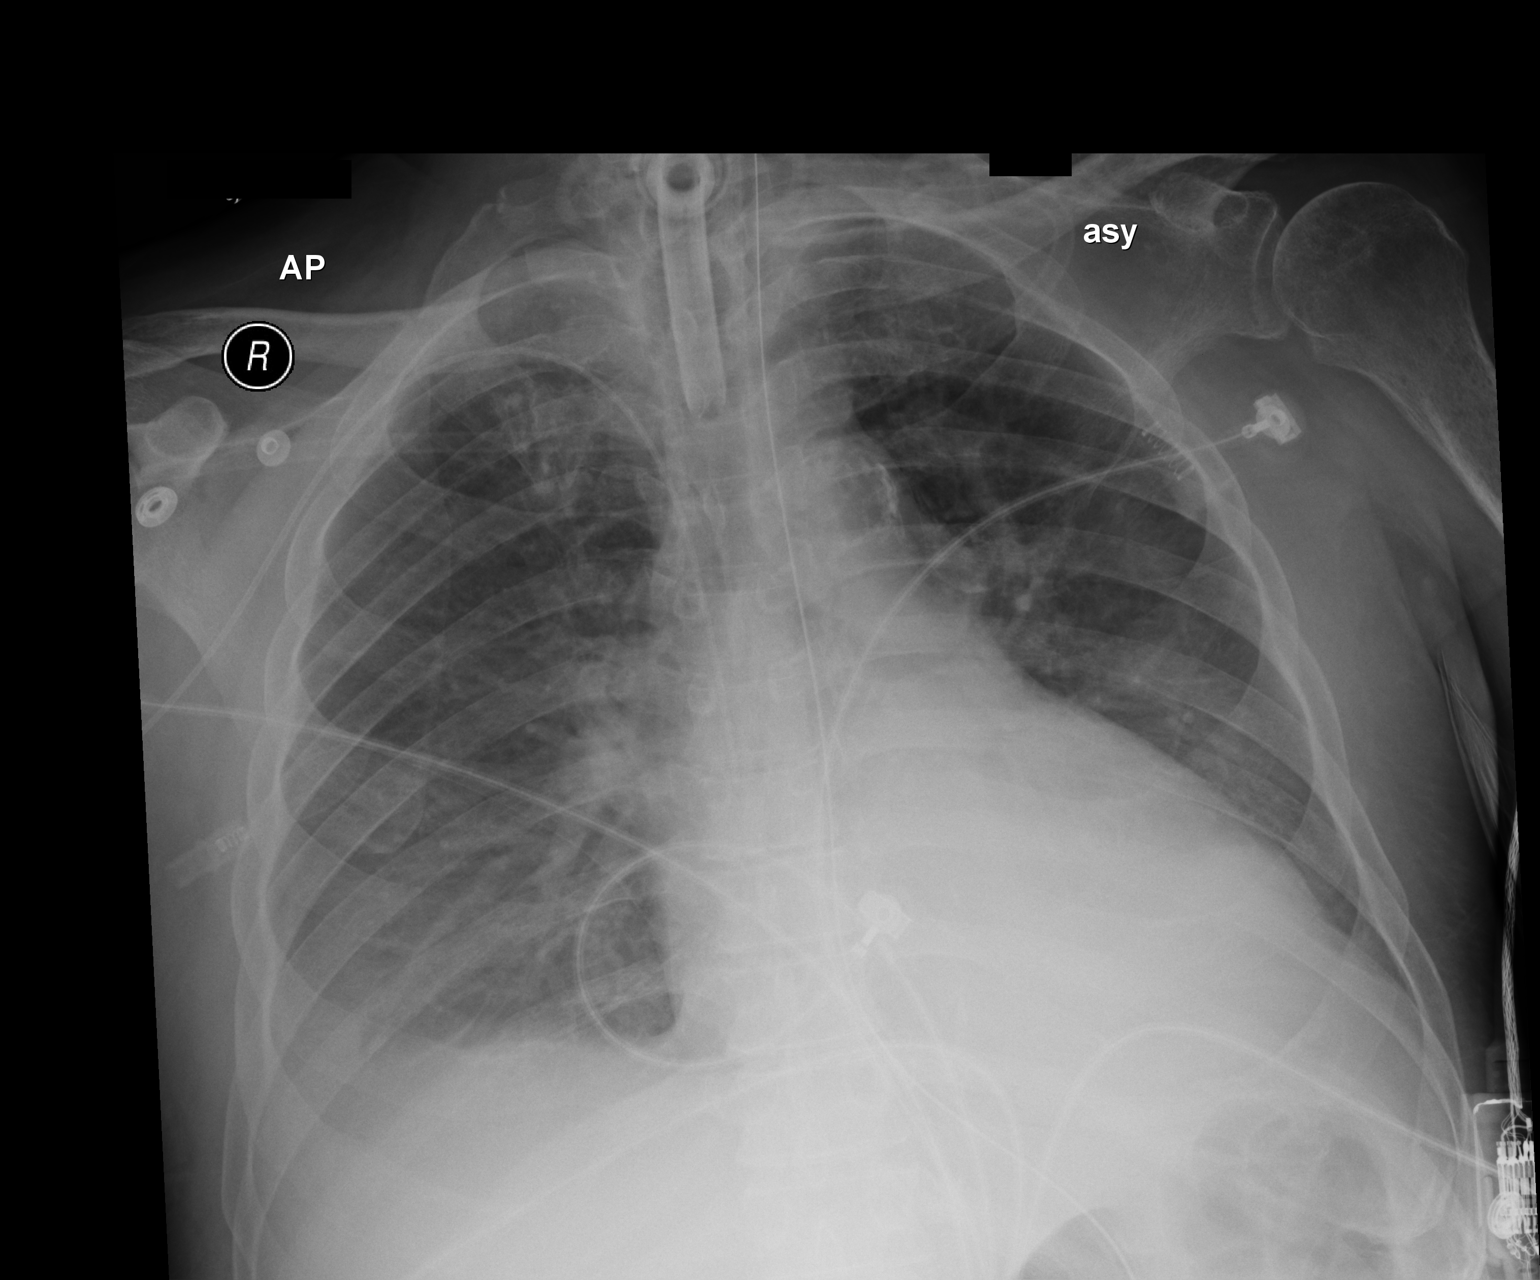

[1 of 1 positions shown; findings below may reference images not displayed]

FINDINGS: A new tracheostomy tube is in place for the previously seen
endotracheal tube. The new tube is well-positioned. Right PICC and
NG tube are again noted. Bilateral effusions and basilar airspace
disease persist but appear improved. There is no pneumothorax.
IMPRESSION: New tracheostomy tube projects in good position.

Bilateral pleural effusions and basilar airspace disease appear
improved.

## 2018-07-17 DEATH — deceased
